# Patient Record
Sex: Female | Born: 1950
Health system: Southern US, Community
[De-identification: ages and names within clinical notes are randomized; demographics above are authoritative.]

## PROBLEM LIST (undated history)

## (undated) DIAGNOSIS — L570 Actinic keratosis: Secondary | ICD-10-CM

## (undated) DIAGNOSIS — M47812 Spondylosis without myelopathy or radiculopathy, cervical region: Secondary | ICD-10-CM

## (undated) DIAGNOSIS — B029 Zoster without complications: Secondary | ICD-10-CM

## (undated) DIAGNOSIS — N8501 Benign endometrial hyperplasia: Secondary | ICD-10-CM

## (undated) DIAGNOSIS — C4362 Malignant melanoma of left upper limb, including shoulder: Secondary | ICD-10-CM

## (undated) DIAGNOSIS — E7800 Pure hypercholesterolemia, unspecified: Secondary | ICD-10-CM

## (undated) DIAGNOSIS — I10 Essential (primary) hypertension: Secondary | ICD-10-CM

## (undated) HISTORY — DX: Zoster without complications: B02.9

## (undated) HISTORY — DX: Malignant melanoma of left upper limb, including shoulder: C43.62

## (undated) HISTORY — DX: Spondylosis without myelopathy or radiculopathy, cervical region: M47.812

## (undated) HISTORY — PX: DILATION AND CURETTAGE OF UTERUS: SHX78

## (undated) HISTORY — DX: Essential (primary) hypertension: I10

## (undated) HISTORY — DX: Actinic keratosis: L57.0

## (undated) HISTORY — DX: Pure hypercholesterolemia, unspecified: E78.00

## (undated) HISTORY — DX: Benign endometrial hyperplasia: N85.01

---

## 1992-04-02 HISTORY — PX: MINOR BREAST BIOPSY: SHX5977

## 1997-12-08 ENCOUNTER — Other Ambulatory Visit: Admission: RE | Admit: 1997-12-08 | Discharge: 1997-12-08 | Payer: Self-pay | Admitting: *Deleted

## 1998-03-16 ENCOUNTER — Other Ambulatory Visit: Admission: RE | Admit: 1998-03-16 | Discharge: 1998-03-16 | Payer: Self-pay | Admitting: *Deleted

## 1998-12-09 ENCOUNTER — Other Ambulatory Visit: Admission: RE | Admit: 1998-12-09 | Discharge: 1998-12-09 | Payer: Self-pay | Admitting: *Deleted

## 1998-12-10 ENCOUNTER — Other Ambulatory Visit: Admission: RE | Admit: 1998-12-10 | Discharge: 1998-12-10 | Payer: Self-pay | Admitting: *Deleted

## 1999-03-03 HISTORY — PX: BREAST SURGERY: SHX581

## 1999-07-20 ENCOUNTER — Ambulatory Visit (HOSPITAL_BASED_OUTPATIENT_CLINIC_OR_DEPARTMENT_OTHER): Admission: RE | Admit: 1999-07-20 | Discharge: 1999-07-20 | Payer: Self-pay | Admitting: Surgery

## 1999-07-20 ENCOUNTER — Encounter (INDEPENDENT_AMBULATORY_CARE_PROVIDER_SITE_OTHER): Payer: Self-pay | Admitting: *Deleted

## 1999-12-13 ENCOUNTER — Other Ambulatory Visit: Admission: RE | Admit: 1999-12-13 | Discharge: 1999-12-13 | Payer: Self-pay | Admitting: *Deleted

## 2000-02-28 ENCOUNTER — Ambulatory Visit (HOSPITAL_COMMUNITY): Admission: RE | Admit: 2000-02-28 | Discharge: 2000-02-28 | Payer: Self-pay | Admitting: Neurology

## 2000-02-28 ENCOUNTER — Encounter: Payer: Self-pay | Admitting: Neurology

## 2000-12-11 ENCOUNTER — Other Ambulatory Visit: Admission: RE | Admit: 2000-12-11 | Discharge: 2000-12-11 | Payer: Self-pay | Admitting: *Deleted

## 2001-02-14 ENCOUNTER — Ambulatory Visit (HOSPITAL_COMMUNITY): Admission: RE | Admit: 2001-02-14 | Discharge: 2001-02-14 | Payer: Self-pay | Admitting: Gastroenterology

## 2002-02-19 ENCOUNTER — Other Ambulatory Visit: Admission: RE | Admit: 2002-02-19 | Discharge: 2002-02-19 | Payer: Self-pay | Admitting: *Deleted

## 2003-03-03 ENCOUNTER — Other Ambulatory Visit: Admission: RE | Admit: 2003-03-03 | Discharge: 2003-03-03 | Payer: Self-pay | Admitting: *Deleted

## 2003-05-05 ENCOUNTER — Ambulatory Visit (HOSPITAL_COMMUNITY): Admission: RE | Admit: 2003-05-05 | Discharge: 2003-05-05 | Payer: Self-pay | Admitting: Family Medicine

## 2004-01-25 ENCOUNTER — Ambulatory Visit (HOSPITAL_COMMUNITY): Admission: RE | Admit: 2004-01-25 | Discharge: 2004-01-25 | Payer: Self-pay | Admitting: Family Medicine

## 2004-03-08 ENCOUNTER — Other Ambulatory Visit: Admission: RE | Admit: 2004-03-08 | Discharge: 2004-03-08 | Payer: Self-pay | Admitting: *Deleted

## 2004-04-24 ENCOUNTER — Ambulatory Visit (HOSPITAL_COMMUNITY): Admission: RE | Admit: 2004-04-24 | Discharge: 2004-04-24 | Payer: Self-pay | Admitting: Obstetrics and Gynecology

## 2004-04-24 ENCOUNTER — Encounter (INDEPENDENT_AMBULATORY_CARE_PROVIDER_SITE_OTHER): Payer: Self-pay | Admitting: Specialist

## 2004-04-24 ENCOUNTER — Ambulatory Visit (HOSPITAL_BASED_OUTPATIENT_CLINIC_OR_DEPARTMENT_OTHER): Admission: RE | Admit: 2004-04-24 | Discharge: 2004-04-24 | Payer: Self-pay | Admitting: Obstetrics and Gynecology

## 2004-04-24 DIAGNOSIS — N8501 Benign endometrial hyperplasia: Secondary | ICD-10-CM

## 2004-04-24 HISTORY — PX: HYSTEROSCOPY WITH RESECTOSCOPE: SHX5395

## 2004-04-24 HISTORY — DX: Benign endometrial hyperplasia: N85.01

## 2005-03-09 ENCOUNTER — Other Ambulatory Visit: Admission: RE | Admit: 2005-03-09 | Discharge: 2005-03-09 | Payer: Self-pay | Admitting: Obstetrics and Gynecology

## 2005-06-14 ENCOUNTER — Ambulatory Visit (HOSPITAL_COMMUNITY): Admission: RE | Admit: 2005-06-14 | Discharge: 2005-06-14 | Payer: Self-pay | Admitting: Family Medicine

## 2006-04-16 ENCOUNTER — Other Ambulatory Visit: Admission: RE | Admit: 2006-04-16 | Discharge: 2006-04-16 | Payer: Self-pay | Admitting: Obstetrics and Gynecology

## 2007-05-05 ENCOUNTER — Other Ambulatory Visit: Admission: RE | Admit: 2007-05-05 | Discharge: 2007-05-05 | Payer: Self-pay | Admitting: Obstetrics and Gynecology

## 2007-09-01 LAB — HM DEXA SCAN: HM Dexa Scan: NORMAL

## 2007-09-25 ENCOUNTER — Ambulatory Visit (HOSPITAL_COMMUNITY): Admission: RE | Admit: 2007-09-25 | Discharge: 2007-09-25 | Payer: Self-pay | Admitting: Family Medicine

## 2007-09-29 ENCOUNTER — Ambulatory Visit (HOSPITAL_COMMUNITY): Admission: RE | Admit: 2007-09-29 | Discharge: 2007-09-29 | Payer: Self-pay | Admitting: Family Medicine

## 2008-05-05 ENCOUNTER — Other Ambulatory Visit: Admission: RE | Admit: 2008-05-05 | Discharge: 2008-05-05 | Payer: Self-pay | Admitting: Obstetrics and Gynecology

## 2008-10-19 ENCOUNTER — Ambulatory Visit (HOSPITAL_COMMUNITY): Admission: RE | Admit: 2008-10-19 | Discharge: 2008-10-19 | Payer: Self-pay | Admitting: Family Medicine

## 2009-05-12 LAB — HM PAP SMEAR: HM Pap smear: NEGATIVE

## 2009-12-02 ENCOUNTER — Ambulatory Visit (HOSPITAL_COMMUNITY): Admission: RE | Admit: 2009-12-02 | Discharge: 2009-12-02 | Payer: Self-pay | Admitting: Family Medicine

## 2010-04-23 ENCOUNTER — Encounter: Payer: Self-pay | Admitting: Family Medicine

## 2010-08-18 NOTE — Procedures (Signed)
[facility]  Patient:    Virginia Brooks, Virginia Brooks Visit Number: [id number] MRN: [id number]          Service Type: END Location: ENDO Attending Physician:  Louie Bun Proc. Date: 02/14/01 Admit Date:  02/14/2001   CC:         Andres Ege, M.D.   Procedure Report  PROCEDURE:  Colonoscopy.  INDICATION FOR PROCEDURE:  Intermittent rectal bleeding in a 60 year old patient with no prior colon screening.  DESCRIPTION OF PROCEDURE:  The patient was placed in the left lateral decubitus position and placed on the pulse monitor with continuous low-flow oxygen delivered by nasal cannula.  She was sedated with 50 mg of IV Demerol and 5 mg of IV Versed.  The Olympus video colonoscope was inserted into the rectum and advanced to the cecum, confirmed by transillumination at McBurneys point and visualization of the ileocecal valve and appendiceal orifice.  The prep was excellent.  The cecum, ascending, transverse, descending, and sigmoid colon all appeared normal with no masses, polyps, diverticula or other mucosal abnormalities.  The rectum likewise appeared normal, and retroflex view of the anus revealed small internal hemorrhoids with no stigma of hemorrhage.  The colonoscope was then withdrawn and the patient returned to the recovery room in stable condition.  She tolerated the procedure well, and there were no immediate complications.  IMPRESSION: 1. Internal hemorrhoids. 2. Otherwise normal colonoscopy.  PLAN:  Flexible sigmoidoscopy and Hemoccults in five years. Attending Physician:  Louie Bun DD:  02/14/01 TD:  02/14/01 Job: [id number]

## 2010-08-18 NOTE — Op Note (Signed)
[place]  Patient:    Virginia Brooks, Virginia Brooks                          MRN: [id number] Proc. Date: 07/20/99 Adm. Date:  [id number] Attending:  Charlton Haws CC:         Jeralyn Ruths, M.D.             Luciana Axe, M.D., [place]                           Operative Report  OFFICE MEDICAL RECORD #:  [id number]  PREOPERATIVE DIAGNOSIS:  Probable intraductal papilloma - left breast.  POSTOPERATIVE DIAGNOSIS:  Probable intraductal papilloma - left breast.  OPERATION:  Excision left breast ductal tissue.  SURGEON:  Currie Paris, M.D.  ASSISTANTLorin Picket Long, PA-student.  ANESTHESIA:  General.  CLINICAL HISTORY:  This patient is a 60 year old, who has presented with some intermittently bloody nipple discharge from a 6 oclock position of the left breast.  Dr. Cheree Ditto showed what appeared to be a filling defect.  DESCRIPTION OF PROCEDURE:  Patient brought to the operating room.  After satisfactory general anesthesia had been obtained, the breast was prepped and draped.  With gentle manipulation, I got a small amount of rusty colored fluid rom 6 oclock duct and was able to cannulate it with a small tear duct probe.  With he tear duct probe in place, I made an incision near the areolar margin inferiorly and lifted a skin flap back towards the ductal area.  I was able then to identify dilated milk duct and dissected around this with a hemostat.  The probe was removed and the duct divided off of the undersurface of the nipple.  I was able to identify its approximate tract with the tear duct probe and then using cutting current of the cautery, took an area of tissue about 1.5 cm in diameter around the duct going about 3-4 cm deep.  This was sent for pathology.  The area was checked for hemostasis, when everything was dry, was closed with some 3-0 Vicryl.  I did free up the nipple a little bit from underneath and initially put a  purse-string in o try to evert the nipple since the patient had been concerned that her nipple had been everted all of her life, but on doing that, the nipple looked a little bit  ischemic, so I took that suture out and the nipple then looked perfectly viable and appeared to stay somewhat everted, but I elected not to put any further sutures in because I was concerned about the blood supply being impaired if I did so. Since everything was dry, I then closed the dermis with 4-0 Monocryl subcuticular, plus Steri-Strips.  The patient tolerated the procedure well.  There were no operative complications.  All counts were correct. DD:  07/20/99 TD:  07/20/99 Job: [id number]

## 2010-08-18 NOTE — Op Note (Signed)
NAME:  Virginia Brooks, Virginia Brooks                 ACCOUNT NO.:  [id number]   MEDICAL RECORD NO.:  [id number]          PATIENT TYPE:  AMB   LOCATION:  [facility]                         FACILITY:  [facility]   PHYSICIAN:  Cynthia P. Romine, M.D.DATE OF BIRTH:  Dec 01, 1950   DATE OF PROCEDURE:  04/24/2004  DATE OF DISCHARGE:                                 OPERATIVE REPORT   PREOPERATIVE DIAGNOSIS:  Postmenopausal bleeding.   POSTOPERATIVE DIAGNOSES:  1.  Postmenopausal bleeding.  2.  Endometrial polyps, pathology pending.   PROCEDURES:  1.  Hysteroscopic resection of endometrial polyps.  2.  D&C.   SURGEON:  Cynthia P. Romine, M.D.   ANESTHESIA:  General by LMA.   ESTIMATED BLOOD LOSS:  30 mL.   COMPLICATIONS:  None.   DESCRIPTION OF PROCEDURE:  The patient was taken to the operating room and  after the induction of adequate general anesthesia, was placed in the dorsal  lithotomy position and prepped and draped in the usual fashion.  The bladder  was drained with a red rubber catheter.  The cervix was grasped at the  anterior lip with a single-tooth tenaculum and 10 mL of 1% Xylocaine was  injected into the cervical stroma.  Attempt was made to sound the uterus,  however, the os was too small to allow passage of the sound.  Therefore, the  very smallest Pratt dilators were used starting with a #7 and this was  passed easily through the os and sequential dilating allowed the cervix to  be dilated up to a #23 which should be large enough for the diagnostic  hysteroscope.  The uterus was then sounded to 8 cm.  The diagnostic  hysteroscope was introduced.  There were noted to be two polyps in the  corpus, one emanating from the right mid section and one from the left.  Photographic documentation was taken.  The diagnostic scope was removed.  The cervix was dilated to #31 Shawnie Pons.  The operative scope was introduced.  Sorbitol was used as a distention medium.  The pressure on the Sorbitol pump  was set at  70 mmHg.  A single loop was used to remove the two endometrial  polyps.  The cavity then appeared clean.  The scope was withdrawn.  Curettage was carried out and endometrial curettings were sent to pathology  as well.  Instruments were removed from the vagina and the procedure was  terminated.  The patient tolerated it well and went in satisfactory  condition to post anesthesia recovery.  Sorbitol deficit was 85 mL.                                               ______________________________  Edwena Felty. Romine, M.D.   CPR/MEDQ  D:  04/24/2004  T:  04/24/2004  Job:  [id number]

## 2010-09-11 LAB — HM COLONOSCOPY: HM Colonoscopy: NORMAL

## 2010-12-22 ENCOUNTER — Other Ambulatory Visit (HOSPITAL_COMMUNITY): Payer: Self-pay | Admitting: Family Medicine

## 2010-12-22 ENCOUNTER — Ambulatory Visit (HOSPITAL_COMMUNITY)
Admission: RE | Admit: 2010-12-22 | Discharge: 2010-12-22 | Disposition: A | Discharge status: Home / Self Care | Payer: Federal, State, Local not specified - PPO | Source: Ambulatory Visit | Attending: Family Medicine | Admitting: Family Medicine

## 2010-12-22 DIAGNOSIS — Z0000 Encounter for general adult medical examination without abnormal findings: Secondary | ICD-10-CM

## 2010-12-22 DIAGNOSIS — E785 Hyperlipidemia, unspecified: Secondary | ICD-10-CM

## 2010-12-22 DIAGNOSIS — Z01419 Encounter for gynecological examination (general) (routine) without abnormal findings: Secondary | ICD-10-CM

## 2012-05-20 ENCOUNTER — Encounter: Payer: Self-pay | Admitting: Obstetrics and Gynecology

## 2012-06-25 ENCOUNTER — Encounter: Payer: Self-pay | Admitting: Obstetrics and Gynecology

## 2012-06-25 ENCOUNTER — Ambulatory Visit (INDEPENDENT_AMBULATORY_CARE_PROVIDER_SITE_OTHER): Payer: Federal, State, Local not specified - PPO | Admitting: Obstetrics and Gynecology

## 2012-06-25 VITALS — BP 152/78 | Ht 63.0 in | Wt 131.0 lb

## 2012-06-25 DIAGNOSIS — Z01419 Encounter for gynecological examination (general) (routine) without abnormal findings: Secondary | ICD-10-CM

## 2012-06-25 MED ORDER — ESTROGENS CONJUGATED 0.3 MG PO TABS: 0.3000 mg | ORAL_TABLET | Freq: Every day | ORAL | Status: DC

## 2012-06-25 MED ORDER — MEDROXYPROGESTERONE ACETATE 2.5 MG PO TABS: 2.5000 mg | ORAL_TABLET | Freq: Every day | ORAL | Status: DC

## 2012-06-25 NOTE — Progress Notes (Signed)
62 y.o. WidowedCaucasian female   G2P2 here for annual exam.  Good year.  No new medical history.  On HRT, feels good, wants to continue.  Originally went on for hot flashes, and has reduced dose since originally rx'd.  Instructed pt she may stop at any time if she desires. No vaginal bleeding.   Patient's last menstrual period was 04/02/2004.          Sexually active: no  The current method of family planning is none.    Exercising: cardio, walking,light wts  [place] Last mammogram:  06/11/2012 normal Last pap smear:05/12/2009 neg History of abnormal pap: 51yrs ago Smoking:no Alcohol:no Last colonoscopy:09/11/2010 normal repeat in 22yrs Last Bone Density:  09/01/2007 normal  Insurance doesn't pay for it and so she doesn't want to have it. Last tetanus shot:01/02/2006 Last cholesterol check: 12/26/2011 slightly elevated  Hgb:      pcp           Urine: pcp    Health Maintenance  Topic Date Due  . Influenza Vaccine  [birth year]01/1948  . Tetanus/tdap  09/04/1967  . Zostavax  09/03/2008  . Pap Smear  05/13/2012  . Mammogram  05/21/2013  . Colonoscopy  07/25/2015    Family History  Problem Relation Age of Onset  . Colon cancer Other   . COPD Mother   . Angina Mother   . Stroke Father   . Heart attack Father   . Colon cancer Sister   . Cancer Brother   . Diabetes Brother     prostate ca, past h/o bladder ca    There is no problem list on file for this patient.   Past Medical History  Diagnosis Date  . Hypercholesterolemia   . Spondylosis of cervical joint   . Endometrial hyperplasia without atypia, complex 04/24/2004    on hysteroscopic resection of polyps    Past Surgical History  Procedure Laterality Date  . Hysteroscopy with resectoscope  04/24/2004  . Minor breast biopsy Right 1994  . Breast surgery  Dec 2000    excision papilloma    Allergies: Review of patient's allergies indicates no known allergies.  Current Outpatient Prescriptions  Medication Sig  Dispense Refill  . aspirin 81 MG tablet Take 81 mg by mouth daily.      . Calcium-Vitamin D (CALTRATE 600 PLUS-VIT D PO) Take 1 tablet by mouth daily.      . cholecalciferol (VITAMIN D) 1000 UNITS tablet Take 1,000 Units by mouth daily.      Marland Kitchen estrogens, conjugated, (PREMARIN) 0.3 MG tablet Take 0.3 mg by mouth daily. Take daily for 21 days then do not take for 7 days.      . fish oil-omega-3 fatty acids 1000 MG capsule Take 1 g by mouth daily.      . medroxyPROGESTERone (PROVERA) 2.5 MG tablet Take 2.5 mg by mouth daily.      . MULTIPLE VITAMINS PO Take 1 tablet by mouth daily.       No current facility-administered medications for this visit.    ROS: Pertinent items are noted in HPI.  Exam:    BP 152/78  Ht 5\' 3"  (1.6 m)  Wt 131 lb (59.421 kg)  BMI 23.21 kg/m2  LMP 04/02/2004   Wt Readings from Last 3 Encounters:  06/25/12 131 lb (59.421 kg)     Ht Readings from Last 3 Encounters:  06/25/12 5\' 3"  (1.6 m)    General appearance: alert, cooperative and appears  stated age Head: Normocephalic, without obvious abnormality, atraumatic Neck: no adenopathy, supple, symmetrical, trachea midline and thyroid not enlarged, symmetric, no tenderness/mass/nodules Lungs: clear to auscultation bilaterally Breasts: Inspection negative, No nipple retraction or dimpling, No nipple discharge or bleeding, No axillary or supraclavicular adenopathy, Normal to palpation without dominant masses Heart: regular rate and rhythm Abdomen: soft, non-tender; bowel sounds normal; no masses,  no organomegaly Extremities: extremities normal, atraumatic, no cyanosis or edema Skin: Skin color, texture, turgor normal. No rashes or lesions Lymph nodes: Cervical, supraclavicular, and axillary nodes normal. No abnormal inguinal nodes palpated Neurologic: Grossly normal   Pelvic: External genitalia:  no lesions              Urethra:  normal appearing urethra with no masses, tenderness or lesions               Bartholins and Skenes: normal                 Vagina: normal appearing vagina with normal color and discharge, no lesions              Cervix: normal appearance              Pap taken: yes        Bimanual Exam:  Uterus:  uterus is normal size, shape, consistency and nontender                                      Adnexa: normal adnexa in size, nontender and no masses                                      Rectovaginal: Confirms                                      Anus:  normal sphincter tone, no lesions  A: nl menopausal exam, on HRT  P: mammogram counseled on breast self exam, mammography screening, adequate intake of calcium and vitamin D, diet and exercise return annually or prn     An After Visit Summary was printed and given to the patient.

## 2012-06-25 NOTE — Patient Instructions (Signed)

## 2012-06-26 LAB — IPS PAP TEST WITH REFLEX TO HPV

## 2012-06-27 ENCOUNTER — Other Ambulatory Visit: Payer: Self-pay | Admitting: *Deleted

## 2012-06-27 MED ORDER — ESTROGENS CONJUGATED 0.3 MG PO TABS: 0.3000 mg | ORAL_TABLET | Freq: Every day | ORAL | Status: DC

## 2013-07-01 ENCOUNTER — Encounter: Payer: Self-pay | Admitting: Obstetrics and Gynecology

## 2013-07-01 ENCOUNTER — Ambulatory Visit (INDEPENDENT_AMBULATORY_CARE_PROVIDER_SITE_OTHER): Payer: Federal, State, Local not specified - PPO | Admitting: Obstetrics and Gynecology

## 2013-07-01 VITALS — BP 118/70 | HR 66 | Ht 63.25 in | Wt 127.0 lb

## 2013-07-01 DIAGNOSIS — Z0000 Encounter for general adult medical examination without abnormal findings: Secondary | ICD-10-CM

## 2013-07-01 DIAGNOSIS — Z01419 Encounter for gynecological examination (general) (routine) without abnormal findings: Secondary | ICD-10-CM

## 2013-07-01 MED ORDER — ESTROGENS CONJUGATED 0.3 MG PO TABS: 0.3000 mg | ORAL_TABLET | Freq: Every day | ORAL | Status: DC

## 2013-07-01 MED ORDER — MEDROXYPROGESTERONE ACETATE 2.5 MG PO TABS: 2.5000 mg | ORAL_TABLET | Freq: Every day | ORAL | Status: DC

## 2013-07-01 NOTE — Progress Notes (Signed)
Patient ID: Virginia Brooks, female   DOB: 1950-11-03, 63 y.o.   MRN: [id number] GYNECOLOGY VISIT  PCP:  Sharilyn Sites, MD  Referring provider:   HPI: 63 y.o.   Widowed  Caucasian  female   G33P2 with Patient's last menstrual period was 04/02/2004.   here for  AEX.  Patient is on Premarin 0.3 and Provera 2.5 mg daily.  Taking for vasomotor symptoms.  No spotting or bleeding.  Wants to continue with HRT.   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 Traveling also to [place].   Hgb:   14.5 with PCP 12/2012 Urine:  Neg  GYNECOLOGIC HISTORY: Patient's last menstrual period was 04/02/2004. Sexually active:  no Partner preference: female Contraception: postmenopausal   Menopausal hormone therapy: Premarin and Provera DES exposure:  no  Blood transfusions: no   Sexually transmitted diseases:  no  GYN procedures and prior surgeries: Hysteroscopic resection of polyps 2006 - history of complex hyperplasia without atypia, Bilateral breast biopsies--benign.  Last mammogram: 06-15-13 RCV:ELFYB                Last pap and high risk HPV testing:  06/25/12 wnl:no HR HPV testing done.   History of abnormal pap smear:  no   OB History   Grav Para Term Preterm Abortions TAB SAB Ect Mult Living   2 2               LIFESTYLE: Exercise:  Treadmill, sit-ups and stretching/bending             Tobacco:   no Alcohol:     no Drug use:  no  OTHER HEALTH MAINTENANCE: Tetanus/TDap:  12/2005 Gardisil:              n/a Influenza:           12/2012 Zostavax:           Completed with PCP  Bone density:     2009 wnl Colonoscopy:     09/2010 normal with [facility].  Next colonoscopy due 6/ 2017 because of family history of colon cancer.  Cholesterol check:  wnl with PCP  Family History  Problem Relation Age of Onset  . Colon cancer Other   . COPD Mother   . Angina Mother   . Stroke Father   . Heart attack Father   . Colon cancer Sister   . Cancer Brother   . Diabetes Brother     prostate ca, past h/o  bladder ca    There are no active problems to display for this patient.  Past Medical History  Diagnosis Date  . Hypercholesterolemia   . Spondylosis of cervical joint   . Endometrial hyperplasia without atypia, complex 04/24/2004    on hysteroscopic resection of polyps    Past Surgical History  Procedure Laterality Date  . Hysteroscopy with resectoscope  04/24/2004  . Minor breast biopsy Right 1994  . Breast surgery  Dec 2000    excision papilloma    ALLERGIES: Review of patient's allergies indicates no known allergies.  Current Outpatient Prescriptions  Medication Sig Dispense Refill  . aspirin 81 MG tablet Take 81 mg by mouth daily.      . Calcium-Vitamin D (CALTRATE 600 PLUS-VIT D PO) Take 1 tablet by mouth daily.      . cholecalciferol (VITAMIN D) 1000 UNITS tablet Take 1,000 Units by mouth daily.      Marland Kitchen estrogens, conjugated, (PREMARIN) 0.3 MG tablet Take 1 tablet (0.3 mg total) by  mouth daily.  90 tablet  3  . fish oil-omega-3 fatty acids 1000 MG capsule Take 1 g by mouth daily.      . medroxyPROGESTERone (PROVERA) 2.5 MG tablet Take 1 tablet (2.5 mg total) by mouth daily.  90 tablet  3  . MULTIPLE VITAMINS PO Take 1 tablet by mouth daily.       No current facility-administered medications for this visit.     ROS:  Pertinent items are noted in HPI.  SOCIAL HISTORY:  Widow.   Husband died of esophageal cancer.  4 grandchildren.  Helps to take care of them.   PHYSICAL EXAMINATION:    BP 118/70  Pulse 66  Ht 5' 3.25" (1.607 m)  Wt 127 lb (57.607 kg)  BMI 22.31 kg/m2  LMP 04/02/2004   Wt Readings from Last 3 Encounters:  07/01/13 127 lb (57.607 kg)  06/25/12 131 lb (59.421 kg)     Ht Readings from Last 3 Encounters:  07/01/13 5' 3.25" (1.607 m)  06/25/12 5\' 3"  (1.6 m)    General appearance: alert, cooperative and appears stated age Head: Normocephalic, without obvious abnormality, atraumatic Neck: no adenopathy, supple, symmetrical, trachea midline and  thyroid not enlarged, symmetric, no tenderness/mass/nodules Lungs: clear to auscultation bilaterally Breasts: Inspection negative, No nipple retraction or dimpling, No nipple discharge or bleeding, No axillary or supraclavicular adenopathy, Normal to palpation without dominant masses Heart: regular rate and rhythm Abdomen: soft, non-tender; no masses,  no organomegaly Extremities: extremities normal, atraumatic, no cyanosis or edema Skin: Skin color, texture, turgor normal. No rashes or lesions Lymph nodes: Cervical, supraclavicular, and axillary nodes normal. No abnormal inguinal nodes palpated Neurologic: Grossly normal  Pelvic: External genitalia:  no lesions              Urethra:  normal appearing urethra with no masses, tenderness or lesions              Bartholins and Skenes: normal                 Vagina: normal appearing vagina with normal color and discharge, no lesions              Cervix: normal appearance              Pap and high risk HPV testing done: no.            Bimanual Exam:  Uterus:  uterus is normal size, shape, consistency and nontender                                      Adnexa: normal adnexa in size, nontender and no masses                                      Rectovaginal: Confirms                                      Anus:  normal sphincter tone, no lesions  ASSESSMENT  Normal gynecologic exam. HRT patient.   PLAN  Mammogram recommended yearly.  Pap smear and high risk HPV testing Counseled on self breast exam, Calcium and vitamin D intake, exercise. Will continue with Premarin 0.3 mg daily and Provera 2.5 mg daily #90 RF 3.  I discussed risks and benefits.  Risks include breast cancer, DVT, PE, MI, and stroke.  Return annually or prn   An After Visit Summary was printed and given to the patient.

## 2013-07-01 NOTE — Patient Instructions (Signed)

## 2013-07-08 ENCOUNTER — Ambulatory Visit: Payer: Federal, State, Local not specified - PPO | Admitting: Obstetrics and Gynecology

## 2013-11-30 ENCOUNTER — Telehealth: Payer: Self-pay

## 2013-11-30 NOTE — Telephone Encounter (Signed)
Please schedule an appointment for the patient to discuss in the office.  Thanks.

## 2013-11-30 NOTE — Telephone Encounter (Signed)
Received mailed letter from patient regarding medication management. Patient states that her family doctor Dr.Golding has advised patient to come off of her Premarin and medroxyprogesterone. Patient was placed on these ten years ago after a D & C. Patient would like to know your recommendations on if she should come off of these at this time. Letter to your desk for review.

## 2013-12-01 NOTE — Telephone Encounter (Signed)
Spoke with patient. Advised of message as seen below from [place]. Patient is agreeable but is in the car and will have to call back to schedule appointment.

## 2013-12-01 NOTE — Telephone Encounter (Signed)
Spoke with patient. Appointment scheduled for tomorrow at 2:45pm with Dr.Silva. Patient agreeable to date and time.  Routing to provider for final review. Patient agreeable to disposition. Will close encounter

## 2013-12-02 ENCOUNTER — Ambulatory Visit (INDEPENDENT_AMBULATORY_CARE_PROVIDER_SITE_OTHER): Payer: Medicare Other | Admitting: Obstetrics and Gynecology

## 2013-12-02 ENCOUNTER — Encounter: Payer: Self-pay | Admitting: Obstetrics and Gynecology

## 2013-12-02 VITALS — BP 128/70 | HR 70 | Ht 63.25 in | Wt 132.2 lb

## 2013-12-02 DIAGNOSIS — Z780 Asymptomatic menopausal state: Secondary | ICD-10-CM

## 2013-12-02 DIAGNOSIS — N951 Menopausal and female climacteric states: Secondary | ICD-10-CM | POA: Diagnosis not present

## 2013-12-02 NOTE — Patient Instructions (Signed)
Menopause and Herbal Products Menopause is the normal time of life when menstrual periods stop completely. Menopause is complete when you have missed 12 consecutive menstrual periods. It usually occurs between the ages of 48 to 55, with an average age of 51. Very rarely does a woman develop menopause before 65 years old. At menopause, your ovaries stop producing the female hormones, estrogen and progesterone. This can cause undesirable symptoms and also affect your health. Sometimes the symptoms can occur 4 to 5 years before the menopause begins. There is no relationship between menopause and:  Oral contraceptives.  Number of children you had.  Race.  The age your menstrual periods started (menarche). Heavy smokers and very thin women may develop menopause earlier in life. Estrogen and progesterone hormone treatment is the usual method of treating menopausal symptoms. However, there are women who should not take hormone treatment. This is true of:   Women that have breast or uterine cancer.  Women who prefer not to take hormones because of certain side effects (abnormal uterine bleeding).  Women who are afraid that hormones may cause breast cancer.  Women who have a history of liver disease, heart disease, stroke, or blood clots. For these women, there are other medications that may help treat their menopausal symptoms. These medications are found in plants and botanical products. They can be found in the form of herbs, teas, oils, tinctures, and pills.  CAUSES:  The ovaries stop producing the female hormones estrogen and progesterone.  Other causes include:  Surgery to remove both ovaries.  The ovaries stop functioning for no know reason.  Tumors of the pituitary gland in the brain.  Medical disease that affects the ovaries and hormone production.  Radiation treatment to the abdomen or pelvis.  Chemotherapy that affects the ovaries. PHYTOESTROGENS: Phytoestrogens occur  naturally in plants and plant products. They act like estrogen in the body. Herbal medications are made from these plants and botanical steroids. There are 3 types of phytoestrogens:  Isoflavones (genistein and daidzein) are found in soy, garbanzo beans, miso and tofu foods.  Ligins are found in the shell of seeds. They are used to make oils like flaxseed oil. The bacteria in your intestine act on these foods to produce the estrogen-like hormones.  Coumestans are estrogen-like. Some of the foods they are found in include sunflower seeds and bean sprouts. CONDITIONS AND THEIR POSSIBLE HERBAL TREATMENT:  Hot flashes and night sweats.  Soy, black cohosh and evening primrose.  Irritability, insomnia, depression and memory problems.  Chasteberry, ginseng, and soy.  St. John's wort may be helpful for depression. However, there is a concern of it causing cataracts of the eye and may have bad effects on other medications. St. John's wort should not be taken for long time and without your caregiver's advice.  Loss of libido and vaginal and skin dryness.  Wild yam and soy.  Prevention of coronary heart disease and osteoporosis.  Soy and Isoflavones. Several studies have shown that some women benefit from herbal medications, but most of the studies have not consistently shown that these supplements are much better than placebo. Other forms of treatment to help women with menopausal symptoms include a balanced diet, rest, exercise, vitamin and calcium (with vitamin D) supplements, acupuncture, and group therapy when necessary. THOSE WHO SHOULD NOT TAKE HERBAL MEDICATIONS INCLUDE:  Women who are planning on getting pregnant unless told by your caregiver.  Women who are breastfeeding unless told by your caregiver.  Women who are taking other   prescription medications unless told by your caregiver.  Infants, children, and elderly women unless told by your caregiver. Different herbal medications  have different and unmeasured amounts of the herbal ingredients. There are no regulations, quality control, and standardization of the ingredients in herbal medications. Therefore, the amount of the ingredient in the medication may vary from one herb, pill, tea, oil or tincture to another. Many herbal medications can cause serious problems and can even have poisonous effects if taken too much or too long. If problems develop, the medication should be stopped and recorded by your caregiver. HOME CARE INSTRUCTIONS  Do not take or give children herbal medications without your caregiver's advice.  Let your caregiver know all the medications you are taking. This includes prescription, over-the-counter, eye drops, and creams.  Do not take herbal medications longer or more than recommended.  Tell your caregiver about any side effects from the medication. SEEK MEDICAL CARE IF:  You develop a fever of 102 F (38.9 C), or as directed by your caregiver.  You feel sick to your stomach (nauseous), vomit, or have diarrhea.  You develop a rash.  You develop abdominal pain.  You develop severe headaches.  You start to have vision problems.  You feel dizzy or faint.  You start to feel numbness in any part of your body.  You start shaking (have convulsions). Document Released: 09/05/2007 Document Revised: 03/05/2012 Document Reviewed: 04/04/2010 Exit[employer] Patient Information 2015 [employer]. This information is not intended to replace advice given to you by your health care provider. Make sure you discuss any questions you have with your health care provider.  

## 2013-12-02 NOTE — Progress Notes (Signed)
Patient ID: Virginia Brooks, female   DOB: 03-Dec-1950, 63 y.o.   MRN: [id number] GYNECOLOGY  VISIT   PCP - Dr. Hilma Favors   HPI: 63 y.o.   Widowed  Caucasian  female   G44P2 with Patient's last menstrual period was 04/02/2004.   here for   Consultation regarding HRT. Has been on HRT for 20 years.   Patient had early menopause.  Has been on this regimen for about 10 years since her dilation and curettage for complex endometrial hyperplasia.  No bleeding since dilation and curettage.  Occasionally feels warm but does not feel she has hot flashes.  Sleeps well other than up 1 -2 times to void.   Father had heart attack and stroke.  Mother had angina.  Mother died in as a pedestrian vs. MVA accident.   Patient wakes up at night 2 - 3 times a year with pain in chest and this lasts a few minutes.  Takes an aspirin.  Had a stress test and EKG which were normal.  Had testing at [facility]. 2005 or 2006.   Does vigorous exercise several days a week.   Is going to see her PCP in about one week for routine visit.   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   GYNECOLOGIC HISTORY: Patient's last menstrual period was 04/02/2004. Contraception:  Postmenopausal. Menopausal hormone therapy: Premarin 0.3mg , Provera 2.5mg         OB History   Grav Para Term Preterm Abortions TAB SAB Ect Mult Living   2 2                 There are no active problems to display for this patient.   Past Medical History  Diagnosis Date  . Hypercholesterolemia   . Spondylosis of cervical joint   . Endometrial hyperplasia without atypia, complex 04/24/2004    on hysteroscopic resection of polyps    Past Surgical History  Procedure Laterality Date  . Hysteroscopy with resectoscope  04/24/2004  . Minor breast biopsy Right 1994  . Breast surgery  Dec 2000    excision papilloma    Current Outpatient Prescriptions  Medication Sig Dispense Refill  . aspirin 81 MG tablet Take 81 mg by mouth  daily.      . Calcium-Vitamin D (CALTRATE 600 PLUS-VIT D PO) Take 1 tablet by mouth daily.      . cholecalciferol (VITAMIN D) 1000 UNITS tablet Take 1,000 Units by mouth daily.      Marland Kitchen estrogens, conjugated, (PREMARIN) 0.3 MG tablet Take 1 tablet (0.3 mg total) by mouth daily.  90 tablet  3  . fish oil-omega-3 fatty acids 1000 MG capsule Take 1 g by mouth daily.      . medroxyPROGESTERone (PROVERA) 2.5 MG tablet Take 1 tablet (2.5 mg total) by mouth daily.  90 tablet  3  . MULTIPLE VITAMINS PO Take 1 tablet by mouth daily.       No current facility-administered medications for this visit.     ALLERGIES: Review of patient's allergies indicates no known allergies.  Family History  Problem Relation Age of Onset  . Colon cancer Other   . COPD Mother   . Angina Mother   . Stroke Father   . Heart attack Father   . Colon cancer Sister   . Cancer Brother   . Diabetes Brother     prostate ca, past h/o bladder ca    History   Social History  . Marital Status: Widowed  Spouse Name: N/A    Number of Children: N/A  . Years of Education: N/A   Occupational History  . Not on file.   Social History Main Topics  . Smoking status: Never Smoker   . Smokeless tobacco: Not on file  . Alcohol Use: No  . Drug Use: No  . Sexual Activity: No   Other Topics Concern  . Not on file   Social History Narrative  . No narrative on file    ROS:  Pertinent items are noted in HPI.  PHYSICAL EXAMINATION:    BP 128/70  Pulse 70  Ht 5' 3.25" (1.607 m)  Wt 132 lb 3.2 oz (59.966 kg)  BMI 23.22 kg/m2  LMP 04/02/2004     General appearance: alert, cooperative and appears stated age  ASSESSMENT  Menopausal HRT.  Remote history of complex endometrial hyperplasia. Family history of cardiovascular disease in father.  Nonspecific chest pain.  Negative prior evaluation.   PLAN  Counseled extensively on risks of benefits of HRT. Women's Health Initiative reviewed.  See labs:  No. Patient  chooses to stop HRT. I recommend evaluation with her PCP regarding chest pain.  Return for annual GYN exam and prn.    An After Visit Summary was printed and given to the patient.  __30____ minutes face to face time of which over 50% was spent in counseling.

## 2013-12-11 ENCOUNTER — Other Ambulatory Visit (HOSPITAL_COMMUNITY): Payer: Self-pay | Admitting: Family Medicine

## 2013-12-11 DIAGNOSIS — Z23 Encounter for immunization: Secondary | ICD-10-CM | POA: Diagnosis not present

## 2013-12-11 DIAGNOSIS — Z0000 Encounter for general adult medical examination without abnormal findings: Secondary | ICD-10-CM | POA: Diagnosis not present

## 2013-12-11 DIAGNOSIS — IMO0002 Reserved for concepts with insufficient information to code with codable children: Secondary | ICD-10-CM | POA: Diagnosis not present

## 2013-12-11 DIAGNOSIS — Z01419 Encounter for gynecological examination (general) (routine) without abnormal findings: Secondary | ICD-10-CM | POA: Diagnosis not present

## 2013-12-11 DIAGNOSIS — E785 Hyperlipidemia, unspecified: Secondary | ICD-10-CM | POA: Diagnosis not present

## 2013-12-11 DIAGNOSIS — M810 Age-related osteoporosis without current pathological fracture: Secondary | ICD-10-CM

## 2013-12-15 ENCOUNTER — Other Ambulatory Visit (HOSPITAL_COMMUNITY): Payer: Federal, State, Local not specified - PPO

## 2013-12-18 ENCOUNTER — Ambulatory Visit (HOSPITAL_COMMUNITY)
Admission: RE | Admit: 2013-12-18 | Discharge: 2013-12-18 | Disposition: A | Discharge status: Home / Self Care | Payer: Medicare Other | Source: Ambulatory Visit | Attending: Family Medicine | Admitting: Family Medicine

## 2013-12-18 DIAGNOSIS — M810 Age-related osteoporosis without current pathological fracture: Secondary | ICD-10-CM | POA: Insufficient documentation

## 2013-12-18 DIAGNOSIS — Z23 Encounter for immunization: Secondary | ICD-10-CM | POA: Diagnosis not present

## 2013-12-18 DIAGNOSIS — Z13820 Encounter for screening for osteoporosis: Secondary | ICD-10-CM | POA: Diagnosis not present

## 2013-12-18 DIAGNOSIS — Z780 Asymptomatic menopausal state: Secondary | ICD-10-CM | POA: Diagnosis not present

## 2014-02-01 ENCOUNTER — Encounter: Payer: Self-pay | Admitting: Obstetrics and Gynecology

## 2014-06-14 DIAGNOSIS — Z803 Family history of malignant neoplasm of breast: Secondary | ICD-10-CM | POA: Diagnosis not present

## 2014-06-14 DIAGNOSIS — Z1231 Encounter for screening mammogram for malignant neoplasm of breast: Secondary | ICD-10-CM | POA: Diagnosis not present

## 2014-07-05 ENCOUNTER — Ambulatory Visit (INDEPENDENT_AMBULATORY_CARE_PROVIDER_SITE_OTHER): Payer: Medicare Other | Admitting: Obstetrics and Gynecology

## 2014-07-05 ENCOUNTER — Encounter: Payer: Self-pay | Admitting: Obstetrics and Gynecology

## 2014-07-05 VITALS — BP 120/74 | HR 70 | Resp 20 | Ht 63.5 in | Wt 135.6 lb

## 2014-07-05 DIAGNOSIS — Z01419 Encounter for gynecological examination (general) (routine) without abnormal findings: Secondary | ICD-10-CM

## 2014-07-05 DIAGNOSIS — Z124 Encounter for screening for malignant neoplasm of cervix: Secondary | ICD-10-CM

## 2014-07-05 NOTE — Patient Instructions (Signed)
EXERCISE AND DIET:  We recommended that you start or continue a regular exercise program for good health. Regular exercise means any activity that makes your heart beat faster and makes you sweat.  We recommend exercising at least 30 minutes per day at least 3 days a week, preferably 4 or 5.  We also recommend a diet low in fat and sugar.  Inactivity, poor dietary choices and obesity can cause diabetes, heart attack, stroke, and kidney damage, among others.    ALCOHOL AND SMOKING:  Women should limit their alcohol intake to no more than 7 drinks/beers/glasses of wine (combined, not each!) per week. Moderation of alcohol intake to this level decreases your risk of breast cancer and liver damage. And of course, no recreational drugs are part of a healthy lifestyle.  And absolutely no smoking or even second hand smoke. Most people know smoking can cause heart and lung diseases, but did you know it also contributes to weakening of your bones? Aging of your skin?  Yellowing of your teeth and nails?  CALCIUM AND VITAMIN D:  Adequate intake of calcium and Vitamin D are recommended.  The recommendations for exact amounts of these supplements seem to change often, but generally speaking 600 mg of calcium (either carbonate or citrate) and 800 units of Vitamin D per day seems prudent. Certain women may benefit from higher intake of Vitamin D.  If you are among these women, your doctor will have told you during your visit.    PAP SMEARS:  Pap smears, to check for cervical cancer or precancers,  have traditionally been done yearly, although recent scientific advances have shown that most women can have pap smears less often.  However, every woman still should have a physical exam from her gynecologist every year. It will include a breast check, inspection of the vulva and vagina to check for abnormal growths or skin changes, a visual exam of the cervix, and then an exam to evaluate the size and shape of the uterus and  ovaries.  And after 66 years of age, a rectal exam is indicated to check for rectal cancers. We will also provide age appropriate advice regarding health maintenance, like when you should have certain vaccines, screening for sexually transmitted diseases, bone density testing, colonoscopy, mammograms, etc.   MAMMOGRAMS:  All women over 40 years old should have a yearly mammogram. Many facilities now offer a "3D" mammogram, which may cost around $50 extra out of pocket. If possible,  we recommend you accept the option to have the 3D mammogram performed.  It both reduces the number of women who will be called back for extra views which then turn out to be normal, and it is better than the routine mammogram at detecting truly abnormal areas.    COLONOSCOPY:  Colonoscopy to screen for colon cancer is recommended for all women at age 50.  We know, you hate the idea of the prep.  We agree, BUT, having colon cancer and not knowing it is worse!!  Colon cancer so often starts as a polyp that can be seen and removed at colonscopy, which can quite literally save your life!  And if your first colonoscopy is normal and you have no family history of colon cancer, most women don't have to have it again for 10 years.  Once every ten years, you can do something that may end up saving your life, right?  We will be happy to help you get it scheduled when you are ready.    Be sure to check your insurance coverage so you understand how much it will cost.  It may be covered as a preventative service at no cost, but you should check your particular policy.     Menopause and Herbal Products Menopause is the normal time of life when menstrual periods stop completely. Menopause is complete when you have missed 12 consecutive menstrual periods. It usually occurs between the ages of 33 to 51, with an average age of 9. Very rarely does a woman develop menopause before 66 years old. At menopause, your ovaries stop producing the female  hormones, estrogen and progesterone. This can cause undesirable symptoms and also affect your health. Sometimes the symptoms can occur 4 to 5 years before the menopause begins. There is no relationship between menopause and:  Oral contraceptives.  Number of children you had.  Race.  The age your menstrual periods started (menarche). Heavy smokers and very thin women may develop menopause earlier in life. Estrogen and progesterone hormone treatment is the usual method of treating menopausal symptoms. However, there are women who should not take hormone treatment. This is true of:   Women that have breast or uterine cancer.  Women who prefer not to take hormones because of certain side effects (abnormal uterine bleeding).  Women who are afraid that hormones may cause breast cancer.  Women who have a history of liver disease, heart disease, stroke, or blood clots. For these women, there are other medications that may help treat their menopausal symptoms. These medications are found in plants and botanical products. They can be found in the form of herbs, teas, oils, tinctures, and pills.  CAUSES:  The ovaries stop producing the female hormones estrogen and progesterone.  Other causes include:  Surgery to remove both ovaries.  The ovaries stop functioning for no know reason.  Tumors of the pituitary gland in the brain.  Medical disease that affects the ovaries and hormone production.  Radiation treatment to the abdomen or pelvis.  Chemotherapy that affects the ovaries. PHYTOESTROGENS: Phytoestrogens occur naturally in plants and plant products. They act like estrogen in the body. Herbal medications are made from these plants and botanical steroids. There are 3 types of phytoestrogens:  Isoflavones (genistein and daidzein) are found in soy, garbanzo beans, miso and tofu foods.  Ligins are found in the shell of seeds. They are used to make oils like flaxseed oil. The bacteria in  your intestine act on these foods to produce the estrogen-like hormones.  Coumestans are estrogen-like. Some of the foods they are found in include sunflower seeds and bean sprouts. CONDITIONS AND THEIR POSSIBLE HERBAL TREATMENT:  Hot flashes and night sweats.  Soy, black cohosh and evening primrose.  Irritability, insomnia, depression and memory problems.  Chasteberry, ginseng, and soy.  St. John's wort may be helpful for depression. However, there is a concern of it causing cataracts of the eye and may have bad effects on other medications. St. John's wort should not be taken for long time and without your caregiver's advice.  Loss of libido and vaginal and skin dryness.  Wild yam and soy.  Prevention of coronary heart disease and osteoporosis.  Soy and Isoflavones. Several studies have shown that some women benefit from herbal medications, but most of the studies have not consistently shown that these supplements are much better than placebo. Other forms of treatment to help women with menopausal symptoms include a balanced diet, rest, exercise, vitamin and calcium (with vitamin D) supplements, acupuncture, and group therapy  when necessary. THOSE WHO SHOULD NOT TAKE HERBAL MEDICATIONS INCLUDE:  Women who are planning on getting pregnant unless told by your caregiver.  Women who are breastfeeding unless told by your caregiver.  Women who are taking other prescription medications unless told by your caregiver.  Infants, children, and elderly women unless told by your caregiver. Different herbal medications have different and unmeasured amounts of the herbal ingredients. There are no regulations, quality control, and standardization of the ingredients in herbal medications. Therefore, the amount of the ingredient in the medication may vary from one herb, pill, tea, oil or tincture to another. Many herbal medications can cause serious problems and can even have poisonous effects if  taken too much or too long. If problems develop, the medication should be stopped and recorded by your caregiver. HOME CARE INSTRUCTIONS  Do not take or give children herbal medications without your caregiver's advice.  Let your caregiver know all the medications you are taking. This includes prescription, over-the-counter, eye drops, and creams.  Do not take herbal medications longer or more than recommended.  Tell your caregiver about any side effects from the medication. SEEK MEDICAL CARE IF:  You develop a fever of 102 F (38.9 C), or as directed by your caregiver.  You feel sick to your stomach (nauseous), vomit, or have diarrhea.  You develop a rash.  You develop abdominal pain.  You develop severe headaches.  You start to have vision problems.  You feel dizzy or faint.  You start to feel numbness in any part of your body.  You start shaking (have convulsions). Document Released: 09/05/2007 Document Revised: 03/05/2012 Document Reviewed: 04/04/2010 [facility] Patient Information 2015 [place], [place]. This information is not intended to replace advice given to you by your health care provider. Make sure you discuss any questions you have with your health care provider.

## 2014-07-05 NOTE — Progress Notes (Signed)
Patient ID: Virginia Brooks, female   DOB: 01-10-1951, 64 y.o.   MRN: [id number] 64 y.o. G2P2 WidowedCaucasianF here for annual exam.    Patient is off HRT since mid-October 2015.  Gained 4 - 5 pounds.  Hot flashes 4 - 5/24 hours.  Some knee pain with squatting.   Still has chest pain occasionally.  Occurs once every 3 - 4 months.  Saw cardiologist more than 5 years ago.  Sees PCP once per year.   Many years ago, went to [facility] and a stress test done and  "they were unable to detect a blockage."  PCP:  Sharilyn Sites, MD  Patient's last menstrual period was 04/02/2004.          Sexually active: No. female The current method of family planning is post menopausal status.     Exercising: Yes  Treadmill, less press, sit ups and other gym exercises. Smoker:  no  Health Maintenance: Pap:  06-25-12 wnl:no HR HPV testing done. History of abnormal Pap:  no MMG:  06-14-14 normal per pt.:[facility]:  09/2010 normal [facility]. Next due 09/2015 because of family history of colon cancer(sister) BMD:   12-18-13 normal:[facility] TDaP:  01-02-06 Screening Labs:   Hb today: PCP, Urine today: PCP   reports that she has never smoked. She does not have any smokeless tobacco history on file. She reports that she does not drink alcohol or use illicit drugs.  Past Medical History  Diagnosis Date  . Hypercholesterolemia   . Spondylosis of cervical joint   . Endometrial hyperplasia without atypia, complex 04/24/2004    on hysteroscopic resection of polyps    Past Surgical History  Procedure Laterality Date  . Hysteroscopy with resectoscope  04/24/2004  . Minor breast biopsy Right 1994  . Breast surgery  Dec 2000    excision papilloma    Current Outpatient Prescriptions  Medication Sig Dispense Refill  . aspirin 81 MG tablet Take 81 mg by mouth daily.    . Calcium-Vitamin D (CALTRATE 600 PLUS-VIT D PO) Take 1 tablet by mouth daily.    . cholecalciferol (VITAMIN D) 1000  UNITS tablet Take 1,000 Units by mouth daily.    . fish oil-omega-3 fatty acids 1000 MG capsule Take 1 g by mouth daily.    . MULTIPLE VITAMINS PO Take 1 tablet by mouth daily.     No current facility-administered medications for this visit.    Family History  Problem Relation Age of Onset  . Colon cancer Other   . COPD Mother   . Angina Mother   . Stroke Father   . Heart attack Father   . Colon cancer Sister 77    colon ca  . Cancer Sister   . Cancer Brother     prostate and bladder ca    ROS:  Pertinent items are noted in HPI.  Otherwise, a comprehensive ROS was negative.  Exam:   BP 120/74 mmHg  Pulse 70  Resp 20  Ht 5' 3.5" (1.613 m)  Wt 135 lb 9.6 oz (61.508 kg)  BMI 23.64 kg/m2  LMP 04/02/2004     Height: 5' 3.5" (161.3 cm)  Ht Readings from Last 3 Encounters:  07/05/14 5' 3.5" (1.613 m)  12/02/13 5' 3.25" (1.607 m)  07/01/13 5' 3.25" (1.607 m)    General appearance: alert, cooperative and appears stated age Head: Normocephalic, without obvious abnormality, atraumatic Neck: no adenopathy, supple, symmetrical, trachea midline and thyroid normal to inspection and palpation  Lungs: clear to auscultation bilaterally Breasts: normal appearance, no masses or tenderness, Inspection negative, No nipple retraction or dimpling, No nipple discharge or bleeding, No axillary or supraclavicular adenopathy Heart: regular rate and rhythm Abdomen: soft, non-tender; bowel sounds normal; no masses,  no organomegaly Extremities: extremities normal, atraumatic, no cyanosis or edema Skin: Skin color, texture, turgor normal. No rashes or lesions Lymph nodes: Cervical, supraclavicular, and axillary nodes normal. No abnormal inguinal nodes palpated Neurologic: Grossly normal   Pelvic: External genitalia:  no lesions              Urethra:  normal appearing urethra with no masses, tenderness or lesions              Bartholins and Skenes: normal                 Vagina: normal appearing  vagina with normal color and discharge, no lesions              Cervix: no lesions              Pap taken: No. Bimanual Exam:  Uterus:  normal size, contour, position, consistency, mobility, non-tender              Adnexa: normal adnexa and no mass, fullness, tenderness               Rectovaginal: Confirms               Anus:  normal sphincter tone, no lesions  Chaperone was present for exam.  A:  Well Woman with normal exam Menopausal symptoms off HRT. Knee joint pain.   P:   Mammogram yearly.  pap smear not indicated.  Reviewed Women's Health Initiative and risks, benefits, and alternatives to HRT including SSRIs, Effexor, herbal options.  Information to patient on herbal treatments for menopausal symptoms.  Follow up with PCP for persistent arthritis problems.  Discussed exercise, calcium and vitamin D.   return annually or prn

## 2014-07-07 ENCOUNTER — Ambulatory Visit: Payer: Federal, State, Local not specified - PPO | Admitting: Obstetrics and Gynecology

## 2014-09-13 DIAGNOSIS — Z79890 Hormone replacement therapy (postmenopausal): Secondary | ICD-10-CM | POA: Diagnosis not present

## 2014-09-13 DIAGNOSIS — Z6822 Body mass index (BMI) 22.0-22.9, adult: Secondary | ICD-10-CM | POA: Diagnosis not present

## 2014-09-13 DIAGNOSIS — N951 Menopausal and female climacteric states: Secondary | ICD-10-CM | POA: Diagnosis not present

## 2014-09-13 DIAGNOSIS — R5383 Other fatigue: Secondary | ICD-10-CM | POA: Diagnosis not present

## 2014-12-17 DIAGNOSIS — S92532A Displaced fracture of distal phalanx of left lesser toe(s), initial encounter for closed fracture: Secondary | ICD-10-CM | POA: Diagnosis not present

## 2014-12-17 DIAGNOSIS — M722 Plantar fascial fibromatosis: Secondary | ICD-10-CM | POA: Diagnosis not present

## 2014-12-23 DIAGNOSIS — Z0000 Encounter for general adult medical examination without abnormal findings: Secondary | ICD-10-CM | POA: Diagnosis not present

## 2014-12-23 DIAGNOSIS — Z6822 Body mass index (BMI) 22.0-22.9, adult: Secondary | ICD-10-CM | POA: Diagnosis not present

## 2014-12-23 DIAGNOSIS — R5383 Other fatigue: Secondary | ICD-10-CM | POA: Diagnosis not present

## 2014-12-23 DIAGNOSIS — Z23 Encounter for immunization: Secondary | ICD-10-CM | POA: Diagnosis not present

## 2014-12-23 DIAGNOSIS — E785 Hyperlipidemia, unspecified: Secondary | ICD-10-CM | POA: Diagnosis not present

## 2014-12-23 DIAGNOSIS — Z1389 Encounter for screening for other disorder: Secondary | ICD-10-CM | POA: Diagnosis not present

## 2015-02-11 DIAGNOSIS — B351 Tinea unguium: Secondary | ICD-10-CM | POA: Diagnosis not present

## 2015-02-11 DIAGNOSIS — M722 Plantar fascial fibromatosis: Secondary | ICD-10-CM | POA: Diagnosis not present

## 2015-06-03 DIAGNOSIS — Z1231 Encounter for screening mammogram for malignant neoplasm of breast: Secondary | ICD-10-CM | POA: Diagnosis not present

## 2015-07-06 ENCOUNTER — Ambulatory Visit: Payer: Medicare Other | Admitting: Obstetrics and Gynecology

## 2015-07-20 ENCOUNTER — Ambulatory Visit (INDEPENDENT_AMBULATORY_CARE_PROVIDER_SITE_OTHER): Payer: Medicare Other | Admitting: Obstetrics and Gynecology

## 2015-07-20 ENCOUNTER — Ambulatory Visit: Payer: Medicare Other | Admitting: Obstetrics and Gynecology

## 2015-07-20 ENCOUNTER — Encounter: Payer: Self-pay | Admitting: Obstetrics and Gynecology

## 2015-07-20 VITALS — BP 118/76 | HR 60 | Resp 20 | Ht 63.25 in | Wt 135.2 lb

## 2015-07-20 DIAGNOSIS — Z124 Encounter for screening for malignant neoplasm of cervix: Secondary | ICD-10-CM | POA: Diagnosis not present

## 2015-07-20 DIAGNOSIS — Z01419 Encounter for gynecological examination (general) (routine) without abnormal findings: Secondary | ICD-10-CM

## 2015-07-20 NOTE — Patient Instructions (Signed)

## 2015-07-20 NOTE — Progress Notes (Signed)
118/76 60 20Patient ID: Virginia Brooks, female   DOB: 1951/02/10, 65 y.o.   MRN: [id number] 64 y.o. G2P2 Widowed Caucasian female here for annual exam.    Doing a lot of horseback riding.  Traveling globally.  Feels she is not having as much flexibility since stopping HRT.  PCP:  Sharilyn Sites, MD   Patient's last menstrual period was 04/02/2004.           Sexually active: No. female The current method of family planning is post menopausal status.    Exercising: Yes.    treadmill, sit-ups and knee press. Smoker:  no  Health Maintenance: Pap:  06-25-12 Neg:Neg HR HPV History of abnormal Pap:  no MMG:  06-03-15 Density B/Neg/BiRads1:[facility]:  09/2010 Normal [facility];Scheduled 08-08-15. BMD:  12-18-13 Result  Normal with PCP TDaP:  12/2005.  Will do with PCP.  Gardasil:   N/A HIV:  PCP. Hep C:  PCP. Screening Labs:  Hb today: PCP, Urine today: PCP   reports that she has never smoked. She does not have any smokeless tobacco history on file. She reports that she does not drink alcohol or use illicit drugs.  Past Medical History  Diagnosis Date  . Hypercholesterolemia   . Spondylosis of cervical joint   . Endometrial hyperplasia without atypia, complex 04/24/2004    on hysteroscopic resection of polyps    Past Surgical History  Procedure Laterality Date  . Hysteroscopy with resectoscope  04/24/2004  . Minor breast biopsy Right 1994  . Breast surgery  Dec 2000    excision papilloma    Current Outpatient Prescriptions  Medication Sig Dispense Refill  . aspirin 81 MG tablet Take 81 mg by mouth daily.    . Calcium-Vitamin D (CALTRATE 600 PLUS-VIT D PO) Take 1 tablet by mouth daily.    . cholecalciferol (VITAMIN D) 1000 UNITS tablet Take 1,000 Units by mouth daily.    . fish oil-omega-3 fatty acids 1000 MG capsule Take 1 g by mouth daily.    . MULTIPLE VITAMINS PO Take 1 tablet by mouth daily.     No current facility-administered medications for this visit.    Family History   Problem Relation Age of Onset  . Colon cancer Other     81   . COPD Mother   . Angina Mother   . Stroke Father   . Heart attack Father   . Colon cancer Sister 69    colon ca  . Cancer Sister   . Cancer Brother     prostate/bladder/kidney ca    ROS:  Pertinent items are noted in HPI.  Otherwise, a comprehensive ROS was negative.  Exam:   BP 118/76 mmHg  Pulse 60  Resp 20  Ht 5' 3.25" (1.607 m)  Wt 135 lb 3.2 oz (61.326 kg)  BMI 23.75 kg/m2  LMP 04/02/2004    General appearance: alert, cooperative and appears stated age Head: Normocephalic, without obvious abnormality, atraumatic Neck: no adenopathy, supple, symmetrical, trachea midline and thyroid normal to inspection and palpation Lungs: clear to auscultation bilaterally Breasts: normal appearance, no masses or tenderness, Inspection negative, No nipple retraction or dimpling, No nipple discharge or bleeding, No axillary or supraclavicular adenopathy, bilateral breast scars.  Right breast with 0.75 cm brownish skin change at 9:00 on edge of areola. Heart: regular rate and rhythm Abdomen: incisions:  No.    , soft, non-tender; no masses, no organomegaly Extremities: extremities normal, atraumatic, no cyanosis or edema Skin: Skin color, texture, turgor normal.  No rashes or lesions Lymph nodes: Cervical, supraclavicular, and axillary nodes normal. No abnormal inguinal nodes palpated Neurologic: Grossly normal  Pelvic: External genitalia:  no lesions              Urethra:  normal appearing urethra with no masses, tenderness or lesions              Bartholins and Skenes: normal                 Vagina: normal appearing vagina with normal color and discharge, no lesions              Cervix: no lesions              Pap taken: Yes.   Bimanual Exam:  Uterus:  normal size, contour, position, consistency, mobility, non-tender              Adnexa: normal adnexa and no mass, fullness, tenderness              Rectal exam: Yes.  .   Confirms.              Anus:  normal sphincter tone, no lesions  Chaperone was present for exam.  Assessment:   Well woman visit with normal exam. FH of colon cancer.  Pigmented change of right breast near nipple.  Plan: Yearly mammogram recommended after age 72.  Recommended self breast exam.  Pap and HR HPV as above. Discussed Calcium, Vitamin D, regular exercise program including cardiovascular and weight bearing exercise. Labs performed.  No..    Labs with PCP.  Prescription medication(s) given.  No..    Patient sees dermatology regularly and will have them check her breast skin coloration.  Follow up annually and prn.      After visit summary provided.

## 2015-07-21 LAB — IPS PAP SMEAR ONLY

## 2015-08-08 DIAGNOSIS — K5730 Diverticulosis of large intestine without perforation or abscess without bleeding: Secondary | ICD-10-CM | POA: Diagnosis not present

## 2015-08-08 DIAGNOSIS — Z86010 Personal history of colonic polyps: Secondary | ICD-10-CM | POA: Diagnosis not present

## 2015-08-12 DIAGNOSIS — B351 Tinea unguium: Secondary | ICD-10-CM | POA: Diagnosis not present

## 2015-08-12 DIAGNOSIS — M722 Plantar fascial fibromatosis: Secondary | ICD-10-CM | POA: Diagnosis not present

## 2015-11-01 DIAGNOSIS — B029 Zoster without complications: Secondary | ICD-10-CM

## 2015-11-01 HISTORY — DX: Zoster without complications: B02.9

## 2015-11-21 ENCOUNTER — Ambulatory Visit (HOSPITAL_COMMUNITY)
Admission: RE | Admit: 2015-11-21 | Discharge: 2015-11-21 | Disposition: A | Discharge status: Home / Self Care | Payer: Medicare Other | Source: Ambulatory Visit | Attending: Family Medicine | Admitting: Family Medicine

## 2015-11-21 ENCOUNTER — Other Ambulatory Visit (HOSPITAL_COMMUNITY): Payer: Self-pay | Admitting: Family Medicine

## 2015-11-21 DIAGNOSIS — N644 Mastodynia: Secondary | ICD-10-CM | POA: Diagnosis not present

## 2015-11-21 DIAGNOSIS — Z6823 Body mass index (BMI) 23.0-23.9, adult: Secondary | ICD-10-CM | POA: Diagnosis not present

## 2015-11-21 DIAGNOSIS — R079 Chest pain, unspecified: Secondary | ICD-10-CM | POA: Diagnosis not present

## 2015-11-21 DIAGNOSIS — E782 Mixed hyperlipidemia: Secondary | ICD-10-CM | POA: Insufficient documentation

## 2015-11-21 DIAGNOSIS — R0789 Other chest pain: Secondary | ICD-10-CM

## 2015-11-21 DIAGNOSIS — Z1389 Encounter for screening for other disorder: Secondary | ICD-10-CM | POA: Diagnosis not present

## 2015-11-24 DIAGNOSIS — N6459 Other signs and symptoms in breast: Secondary | ICD-10-CM | POA: Diagnosis not present

## 2015-11-24 DIAGNOSIS — N644 Mastodynia: Secondary | ICD-10-CM | POA: Diagnosis not present

## 2015-12-01 ENCOUNTER — Telehealth: Payer: Self-pay | Admitting: *Deleted

## 2015-12-01 NOTE — Telephone Encounter (Signed)
Called patient to follow up after she was seen at [facility] for a breast ultrasound on 11/24/2015 for pain and skin rash on right breast. Radiologist suggested shingles. Patient states she is following up with her PCP, Dr. Hilma Favors. She states he is the referring doctor for the exam and he received a copy of the report. Patient states she had a shingles vaccine at age 65, and is due to have blood work done at his office in a few weeks. RN stated to patient our office just wanted to make sure she had follow up in place after the ultrasound. Patient verbalized understanding and appreciative of phone call.   Routing to provider for final review. Patient agreeable to disposition. Will close encounter.    Cc Dr. Quincy Simmonds

## 2015-12-07 ENCOUNTER — Encounter: Payer: Self-pay | Admitting: Obstetrics and Gynecology

## 2015-12-08 DIAGNOSIS — H25811 Combined forms of age-related cataract, right eye: Secondary | ICD-10-CM | POA: Diagnosis not present

## 2015-12-08 DIAGNOSIS — H01024 Squamous blepharitis left upper eyelid: Secondary | ICD-10-CM | POA: Diagnosis not present

## 2015-12-08 DIAGNOSIS — H2512 Age-related nuclear cataract, left eye: Secondary | ICD-10-CM | POA: Diagnosis not present

## 2015-12-08 DIAGNOSIS — H01022 Squamous blepharitis right lower eyelid: Secondary | ICD-10-CM | POA: Diagnosis not present

## 2015-12-08 DIAGNOSIS — H01025 Squamous blepharitis left lower eyelid: Secondary | ICD-10-CM | POA: Diagnosis not present

## 2015-12-08 DIAGNOSIS — H31092 Other chorioretinal scars, left eye: Secondary | ICD-10-CM | POA: Diagnosis not present

## 2015-12-08 DIAGNOSIS — H01021 Squamous blepharitis right upper eyelid: Secondary | ICD-10-CM | POA: Diagnosis not present

## 2015-12-12 DIAGNOSIS — R944 Abnormal results of kidney function studies: Secondary | ICD-10-CM | POA: Diagnosis not present

## 2015-12-19 DIAGNOSIS — B078 Other viral warts: Secondary | ICD-10-CM | POA: Diagnosis not present

## 2015-12-19 DIAGNOSIS — L821 Other seborrheic keratosis: Secondary | ICD-10-CM | POA: Diagnosis not present

## 2015-12-19 DIAGNOSIS — L570 Actinic keratosis: Secondary | ICD-10-CM | POA: Diagnosis not present

## 2015-12-19 DIAGNOSIS — D1801 Hemangioma of skin and subcutaneous tissue: Secondary | ICD-10-CM | POA: Diagnosis not present

## 2015-12-19 DIAGNOSIS — L820 Inflamed seborrheic keratosis: Secondary | ICD-10-CM | POA: Diagnosis not present

## 2015-12-21 DIAGNOSIS — Z23 Encounter for immunization: Secondary | ICD-10-CM | POA: Diagnosis not present

## 2015-12-21 DIAGNOSIS — Z0000 Encounter for general adult medical examination without abnormal findings: Secondary | ICD-10-CM | POA: Diagnosis not present

## 2015-12-21 DIAGNOSIS — Z6823 Body mass index (BMI) 23.0-23.9, adult: Secondary | ICD-10-CM | POA: Diagnosis not present

## 2015-12-24 LAB — BASIC METABOLIC PANEL: Glucose: 132 mg/dL

## 2016-04-27 DIAGNOSIS — M7541 Impingement syndrome of right shoulder: Secondary | ICD-10-CM | POA: Diagnosis not present

## 2016-05-21 ENCOUNTER — Telehealth: Payer: Self-pay | Admitting: Obstetrics and Gynecology

## 2016-05-21 NOTE — Telephone Encounter (Signed)
Left message at both numbers to reschedule cancelled appointment.

## 2016-05-28 DIAGNOSIS — M7541 Impingement syndrome of right shoulder: Secondary | ICD-10-CM | POA: Diagnosis not present

## 2016-06-04 DIAGNOSIS — M7541 Impingement syndrome of right shoulder: Secondary | ICD-10-CM | POA: Diagnosis not present

## 2016-06-13 DIAGNOSIS — M7541 Impingement syndrome of right shoulder: Secondary | ICD-10-CM | POA: Diagnosis not present

## 2016-06-25 DIAGNOSIS — M7541 Impingement syndrome of right shoulder: Secondary | ICD-10-CM | POA: Diagnosis not present

## 2016-07-13 DIAGNOSIS — Z803 Family history of malignant neoplasm of breast: Secondary | ICD-10-CM | POA: Diagnosis not present

## 2016-07-13 DIAGNOSIS — Z1231 Encounter for screening mammogram for malignant neoplasm of breast: Secondary | ICD-10-CM | POA: Diagnosis not present

## 2016-07-20 ENCOUNTER — Ambulatory Visit: Payer: Medicare Other | Admitting: Obstetrics and Gynecology

## 2016-07-25 ENCOUNTER — Ambulatory Visit: Payer: Medicare Other | Admitting: Obstetrics and Gynecology

## 2016-08-03 NOTE — Progress Notes (Signed)
66 y.o. G2P2 Widowed Caucasian female here for annual exam.    Denies any vaginal bleeding or spotting.   Shingles last year.   Had a right should injury.  Rotator cuff 'Fray." No fractures.   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  Labs with PCP.   PCP:  Sharilyn Sites, MD  Patient's last menstrual period was 04/02/2004.           Sexually active: No.  The current method of family planning is post menopausal status.    Exercising: Yes.    [employer](treadmill, bike, sit ups, pull ups) Smoker:  no  Health Maintenance: Pap: 07-20-15 Neg; 06-25-12 Neg:Neg HR HPV History of abnormal Pap:  no MMG: 07-13-16 normal per patient:[facility]8-24-17 3D Diag.Rt.MMG--for Rt.Br.Pain/Density B/Neg/BiRads2/screening 51mo.;Rt.Br.USNeg--would suggest considerations of shingles:[facility]--06-03-15 3D Density B/Neg/BiRads1:[facility] Colonoscopy: 08-08-15 normal with Dr.John Hayes;next due 07/2020 due to family history of colon cancer(sister) BMD: 12-18-13   Result:Normal with PCP TDaP:  12-18-13(PCP) Gardasil:   no HIV: no Hep C: no Screening Labs:  Hb today: PCP, Urine today: not done   reports that she has never smoked. She has never used smokeless tobacco. She reports that she does not drink alcohol or use drugs.  Past Medical History:  Diagnosis Date  . Endometrial hyperplasia without atypia, complex 04/24/2004   on hysteroscopic resection of polyps  . Hypercholesterolemia   . Shingles 11/2015  . Spondylosis of cervical joint     Past Surgical History:  Procedure Laterality Date  . BREAST SURGERY  Dec 2000   excision papilloma  . HYSTEROSCOPY WITH RESECTOSCOPE  04/24/2004  . MINOR BREAST BIOPSY Right 1994    Current Outpatient Prescriptions  Medication Sig Dispense Refill  . aspirin 81 MG tablet Take 81 mg by mouth daily.    . Calcium-Vitamin D (CALTRATE 600 PLUS-VIT D PO) Take 1 tablet by mouth daily.    . cholecalciferol (VITAMIN D) 1000 UNITS tablet Take 1,000 Units by mouth daily.    . fish oil-omega-3  fatty acids 1000 MG capsule Take 1 g by mouth daily.    . MULTIPLE VITAMINS PO Take 1 tablet by mouth daily.     No current facility-administered medications for this visit.     Family History  Problem Relation Age of Onset  . COPD Mother   . Angina Mother   . Stroke Father   . Heart attack Father   . Colon cancer Sister 21    colon ca  . Cancer Sister   . Cancer Brother     prostate/bladder/kidney ca  . Colon cancer Other     48     ROS:  Pertinent items are noted in HPI.  Otherwise, a comprehensive ROS was negative.  Exam:   BP 118/76 (BP Location: Left Arm, Patient Position: Sitting, Cuff Size: Normal)   Pulse (!) 56   Resp 14   Ht 5\' 3"  (1.6 m)   Wt 133 lb 9.6 oz (60.6 kg)   LMP 04/02/2004   BMI 23.67 kg/m     General appearance: alert, cooperative and appears stated age Head: Normocephalic, without obvious abnormality, atraumatic Neck: no adenopathy, supple, symmetrical, trachea midline and thyroid normal to inspection and palpation Lungs: clear to auscultation bilaterally Breasts: normal appearance, no masses or tenderness, No nipple retraction or dimpling, No nipple discharge or bleeding, No axillary or supraclavicular adenopathy Heart: regular rate and rhythm Abdomen: soft, non-tender; no masses, no organomegaly Extremities: extremities normal, atraumatic, no cyanosis or edema Skin: Skin color, texture, turgor normal. No  rashes or lesions Lymph nodes: Cervical, supraclavicular, and axillary nodes normal. No abnormal inguinal nodes palpated Neurologic: Grossly normal  Pelvic: External genitalia:  no lesions.  Seborrheic keratosis of right mons.              Urethra:  normal appearing urethra with no masses, tenderness or lesions              Bartholins and Skenes: normal                 Vagina: normal appearing vagina with normal color and discharge, no lesions              Cervix: no lesions              Pap taken: Not taken.  Bimanual Exam:  Uterus:  normal  size, contour, position, consistency, mobility, non-tender              Adnexa: no mass, fullness, tenderness              Rectal exam: Yes.  .Confirms.              Anus:  normal sphincter tone, no lesions  Chaperone was present for exam.  Assessment:   Well woman visit with normal exam. FH colon cancer.   Plan: Mammogram screening discussed. Recommended self breast awareness. Pap next year.  Guidelines for Calcium, Vitamin D, regular exercise program including cardiovascular and weight bearing exercise. Discussed Hep C aby testing with PCP.  Follow up annually and prn.      After visit summary provided.

## 2016-08-06 ENCOUNTER — Ambulatory Visit (INDEPENDENT_AMBULATORY_CARE_PROVIDER_SITE_OTHER): Payer: Medicare Other | Admitting: Obstetrics and Gynecology

## 2016-08-06 ENCOUNTER — Encounter: Payer: Self-pay | Admitting: Obstetrics and Gynecology

## 2016-08-06 VITALS — BP 118/76 | HR 56 | Resp 14 | Ht 63.0 in | Wt 133.6 lb

## 2016-08-06 DIAGNOSIS — Z01419 Encounter for gynecological examination (general) (routine) without abnormal findings: Secondary | ICD-10-CM | POA: Diagnosis not present

## 2016-08-06 NOTE — Patient Instructions (Signed)

## 2016-08-07 ENCOUNTER — Encounter: Payer: Self-pay | Admitting: Obstetrics and Gynecology

## 2016-09-10 DIAGNOSIS — M533 Sacrococcygeal disorders, not elsewhere classified: Secondary | ICD-10-CM | POA: Diagnosis not present

## 2016-12-04 DIAGNOSIS — Z0000 Encounter for general adult medical examination without abnormal findings: Secondary | ICD-10-CM | POA: Diagnosis not present

## 2016-12-04 DIAGNOSIS — E782 Mixed hyperlipidemia: Secondary | ICD-10-CM | POA: Diagnosis not present

## 2016-12-04 DIAGNOSIS — E785 Hyperlipidemia, unspecified: Secondary | ICD-10-CM | POA: Diagnosis not present

## 2016-12-04 DIAGNOSIS — Z1389 Encounter for screening for other disorder: Secondary | ICD-10-CM | POA: Diagnosis not present

## 2016-12-04 DIAGNOSIS — Z6823 Body mass index (BMI) 23.0-23.9, adult: Secondary | ICD-10-CM | POA: Diagnosis not present

## 2016-12-05 DIAGNOSIS — H25811 Combined forms of age-related cataract, right eye: Secondary | ICD-10-CM | POA: Diagnosis not present

## 2016-12-05 DIAGNOSIS — H01029 Squamous blepharitis unspecified eye, unspecified eyelid: Secondary | ICD-10-CM | POA: Diagnosis not present

## 2016-12-05 DIAGNOSIS — H2512 Age-related nuclear cataract, left eye: Secondary | ICD-10-CM | POA: Diagnosis not present

## 2016-12-05 DIAGNOSIS — H31092 Other chorioretinal scars, left eye: Secondary | ICD-10-CM | POA: Diagnosis not present

## 2016-12-17 DIAGNOSIS — D485 Neoplasm of uncertain behavior of skin: Secondary | ICD-10-CM | POA: Diagnosis not present

## 2016-12-17 DIAGNOSIS — L821 Other seborrheic keratosis: Secondary | ICD-10-CM | POA: Diagnosis not present

## 2016-12-17 DIAGNOSIS — L7211 Pilar cyst: Secondary | ICD-10-CM | POA: Diagnosis not present

## 2016-12-17 DIAGNOSIS — D0362 Melanoma in situ of left upper limb, including shoulder: Secondary | ICD-10-CM | POA: Diagnosis not present

## 2016-12-17 DIAGNOSIS — L812 Freckles: Secondary | ICD-10-CM | POA: Diagnosis not present

## 2016-12-17 DIAGNOSIS — D225 Melanocytic nevi of trunk: Secondary | ICD-10-CM | POA: Diagnosis not present

## 2016-12-21 DIAGNOSIS — M533 Sacrococcygeal disorders, not elsewhere classified: Secondary | ICD-10-CM | POA: Diagnosis not present

## 2016-12-25 DIAGNOSIS — D0362 Melanoma in situ of left upper limb, including shoulder: Secondary | ICD-10-CM | POA: Diagnosis not present

## 2016-12-25 DIAGNOSIS — Z23 Encounter for immunization: Secondary | ICD-10-CM | POA: Diagnosis not present

## 2016-12-28 DIAGNOSIS — M533 Sacrococcygeal disorders, not elsewhere classified: Secondary | ICD-10-CM | POA: Diagnosis not present

## 2017-01-16 DIAGNOSIS — H10412 Chronic giant papillary conjunctivitis, left eye: Secondary | ICD-10-CM | POA: Diagnosis not present

## 2017-01-16 DIAGNOSIS — H10413 Chronic giant papillary conjunctivitis, bilateral: Secondary | ICD-10-CM | POA: Diagnosis not present

## 2017-02-14 DIAGNOSIS — H10413 Chronic giant papillary conjunctivitis, bilateral: Secondary | ICD-10-CM | POA: Diagnosis not present

## 2017-04-16 DIAGNOSIS — M1811 Unilateral primary osteoarthritis of first carpometacarpal joint, right hand: Secondary | ICD-10-CM | POA: Insufficient documentation

## 2017-04-16 DIAGNOSIS — M13841 Other specified arthritis, right hand: Secondary | ICD-10-CM | POA: Diagnosis not present

## 2017-04-23 ENCOUNTER — Ambulatory Visit (HOSPITAL_COMMUNITY)
Admission: RE | Admit: 2017-04-23 | Discharge: 2017-04-23 | Disposition: A | Discharge status: Home / Self Care | Payer: Medicare Other | Source: Ambulatory Visit | Attending: Physician Assistant | Admitting: Physician Assistant

## 2017-04-23 ENCOUNTER — Other Ambulatory Visit (HOSPITAL_COMMUNITY): Payer: Self-pay | Admitting: Physician Assistant

## 2017-04-23 DIAGNOSIS — R509 Fever, unspecified: Secondary | ICD-10-CM

## 2017-04-23 DIAGNOSIS — R05 Cough: Secondary | ICD-10-CM | POA: Diagnosis not present

## 2017-04-23 DIAGNOSIS — Z6822 Body mass index (BMI) 22.0-22.9, adult: Secondary | ICD-10-CM | POA: Diagnosis not present

## 2017-04-23 DIAGNOSIS — R059 Cough, unspecified: Secondary | ICD-10-CM

## 2017-04-23 DIAGNOSIS — R0602 Shortness of breath: Secondary | ICD-10-CM | POA: Diagnosis not present

## 2017-04-23 DIAGNOSIS — Z1389 Encounter for screening for other disorder: Secondary | ICD-10-CM | POA: Diagnosis not present

## 2017-05-13 DIAGNOSIS — Z1389 Encounter for screening for other disorder: Secondary | ICD-10-CM | POA: Diagnosis not present

## 2017-05-28 DIAGNOSIS — M13841 Other specified arthritis, right hand: Secondary | ICD-10-CM | POA: Diagnosis not present

## 2017-06-17 DIAGNOSIS — D1801 Hemangioma of skin and subcutaneous tissue: Secondary | ICD-10-CM | POA: Diagnosis not present

## 2017-06-17 DIAGNOSIS — D2271 Melanocytic nevi of right lower limb, including hip: Secondary | ICD-10-CM | POA: Diagnosis not present

## 2017-06-17 DIAGNOSIS — L723 Sebaceous cyst: Secondary | ICD-10-CM | POA: Diagnosis not present

## 2017-06-17 DIAGNOSIS — D2272 Melanocytic nevi of left lower limb, including hip: Secondary | ICD-10-CM | POA: Diagnosis not present

## 2017-06-17 DIAGNOSIS — L820 Inflamed seborrheic keratosis: Secondary | ICD-10-CM | POA: Diagnosis not present

## 2017-06-17 DIAGNOSIS — Z85820 Personal history of malignant melanoma of skin: Secondary | ICD-10-CM | POA: Diagnosis not present

## 2017-06-17 DIAGNOSIS — L738 Other specified follicular disorders: Secondary | ICD-10-CM | POA: Diagnosis not present

## 2017-06-17 DIAGNOSIS — L812 Freckles: Secondary | ICD-10-CM | POA: Diagnosis not present

## 2017-06-17 DIAGNOSIS — D485 Neoplasm of uncertain behavior of skin: Secondary | ICD-10-CM | POA: Diagnosis not present

## 2017-06-17 DIAGNOSIS — L853 Xerosis cutis: Secondary | ICD-10-CM | POA: Diagnosis not present

## 2017-06-17 DIAGNOSIS — L821 Other seborrheic keratosis: Secondary | ICD-10-CM | POA: Diagnosis not present

## 2017-07-01 DIAGNOSIS — Z23 Encounter for immunization: Secondary | ICD-10-CM | POA: Diagnosis not present

## 2017-07-04 DIAGNOSIS — Z1231 Encounter for screening mammogram for malignant neoplasm of breast: Secondary | ICD-10-CM | POA: Diagnosis not present

## 2017-07-29 DIAGNOSIS — Z23 Encounter for immunization: Secondary | ICD-10-CM | POA: Diagnosis not present

## 2017-08-01 ENCOUNTER — Other Ambulatory Visit: Payer: Self-pay

## 2017-08-01 ENCOUNTER — Other Ambulatory Visit (HOSPITAL_COMMUNITY)
Admission: RE | Admit: 2017-08-01 | Discharge: 2017-08-01 | Disposition: A | Discharge status: Home / Self Care | Payer: Medicare Other | Source: Ambulatory Visit | Attending: Obstetrics and Gynecology | Admitting: Obstetrics and Gynecology

## 2017-08-01 ENCOUNTER — Ambulatory Visit (INDEPENDENT_AMBULATORY_CARE_PROVIDER_SITE_OTHER): Payer: Medicare Other | Admitting: Obstetrics and Gynecology

## 2017-08-01 ENCOUNTER — Encounter: Payer: Self-pay | Admitting: Obstetrics and Gynecology

## 2017-08-01 VITALS — BP 120/72 | HR 68 | Resp 16 | Ht 63.25 in | Wt 135.8 lb

## 2017-08-01 DIAGNOSIS — Z01419 Encounter for gynecological examination (general) (routine) without abnormal findings: Secondary | ICD-10-CM | POA: Diagnosis not present

## 2017-08-01 NOTE — Patient Instructions (Signed)

## 2017-08-01 NOTE — Progress Notes (Signed)
67 y.o. G2P2 Widowed Caucasian female here for annual exam.    New dx of melanoma.  Sees Dr. Wilhemina Bonito at [facility].  Had some coccyx pain and started dong some pelvic floor exercises on her own, and it is resolved now.   No vaginal bleeding.   Still doing mission work.  Doing MMR update.  Also doing oral medication for typhoid.   PCP: Dr. Sharilyn Sites     Patient's last menstrual period was 04/02/2004.           Sexually active: No.  The current method of family planning is post menopausal status.    Exercising: Yes.    [employer] 6 days a week for 20 minutes Smoker:  no  Health Maintenance: Pap:  07/20/15 Pap smear Negative History of abnormal Pap:  Years ago per patient MMG:  07/04/17 - BI-RADS 1 - Density rating B, [facility] Colonoscopy:   08-08-15 normal with Dr.John Hayes;next due 07/2020 due to family history of colon cancer(sister) BMD:   12/18/13  Result  Normal with PCP. TDaP:  About 2017 Gardasil:   n/a HIV:  Donates blood Hep C: September 2019 negative Screening Labs:  PCP   reports that she has never smoked. She has never used smokeless tobacco. She reports that she does not drink alcohol or use drugs.  Past Medical History:  Diagnosis Date  . Endometrial hyperplasia without atypia, complex 04/24/2004   on hysteroscopic resection of polyps  . Hypercholesterolemia   . Keratosis   . Malignant melanoma of left forearm ([place])   . Shingles 11/2015  . Spondylosis of cervical joint     Past Surgical History:  Procedure Laterality Date  . BREAST SURGERY  Dec 2000   excision papilloma  . DILATION AND CURETTAGE OF UTERUS    . HYSTEROSCOPY WITH RESECTOSCOPE  04/24/2004  . MINOR BREAST BIOPSY Right 1994    Current Outpatient Medications  Medication Sig Dispense Refill  . aspirin 81 MG tablet Take 81 mg by mouth daily.    . Calcium-Vitamin D (CALTRATE 600 PLUS-VIT D PO) Take 1 tablet by mouth daily.    . cholecalciferol (VITAMIN D) 1000 UNITS tablet Take 1,000  Units by mouth daily.    . diclofenac sodium (VOLTAREN) 1 % GEL diclofenac 1 % topical gel  apply 2 grams to affected area four times a day    . fish oil-omega-3 fatty acids 1000 MG capsule Take 1 g by mouth daily.    . MULTIPLE VITAMINS PO Take 1 tablet by mouth daily.     No current facility-administered medications for this visit.     Family History  Problem Relation Age of Onset  . COPD Mother   . Angina Mother   . Stroke Father   . Heart attack Father   . Colon cancer Sister 10       colon ca  . Cancer Sister   . Cancer Brother        prostate/bladder/kidney ca  . Colon cancer Other        66     Review of Systems  Constitutional: Negative.   HENT: Negative.   Eyes: Negative.   Respiratory: Negative.   Cardiovascular: Negative.   Gastrointestinal: Negative.   Endocrine: Negative.   Genitourinary: Negative.   Musculoskeletal:       Muscle or joint pain  Skin: Negative.   Allergic/Immunologic: Negative.   Neurological: Negative.   Hematological: Negative.   Psychiatric/Behavioral: Negative.     Exam:  BP 120/72 (BP Location: Right Arm, Patient Position: Sitting, Cuff Size: Normal)   Pulse 68   Resp 16   Ht 5' 3.25" (1.607 m)   Wt 135 lb 12.8 oz (61.6 kg)   LMP 04/02/2004   BMI 23.87 kg/m     General appearance: alert, cooperative and appears stated age Head: Normocephalic, without obvious abnormality, atraumatic Neck: no adenopathy, supple, symmetrical, trachea midline and thyroid normal to inspection and palpation Lungs: clear to auscultation bilaterally Breasts: normal appearance, no masses or tenderness, No nipple retraction or dimpling, No nipple discharge or bleeding, No axillary or supraclavicular adenopathy Heart: regular rate and rhythm Abdomen: soft, non-tender; no masses, no organomegaly Extremities: extremities normal, atraumatic, no cyanosis or edema Skin: Skin color, texture, turgor normal. No rashes or lesions Lymph nodes: Cervical,  supraclavicular, and axillary nodes normal. No abnormal inguinal nodes palpated Neurologic: Grossly normal  Pelvic: External genitalia:  no lesions              Urethra:  normal appearing urethra with no masses, tenderness or lesions              Bartholins and Skenes: normal                 Vagina: normal appearing vagina with normal color and discharge, no lesions              Cervix: no lesions              Pap taken: Yes.   Bimanual Exam:  Uterus:  normal size, contour, position, consistency, mobility, non-tender              Adnexa: no mass, fullness, tenderness              Rectal exam: Yes.  .  Confirms.              Anus:  normal sphincter tone, no lesions  Chaperone was present for exam.  Assessment:   Well woman visit with normal exam. Hx complex endometrial hyperplasia without atypia.  FH colon cancer.  Hx melanoma.   Plan: Mammogram screening. Recommended self breast awareness. Pap and HR HPV as above. Guidelines for Calcium, Vitamin D, regular exercise program including cardiovascular and weight bearing exercise. Discussed sun screen and protection from the sun.  Follow up annually and prn.   After visit summary provided.

## 2017-08-02 LAB — CYTOLOGY - PAP: DIAGNOSIS: NEGATIVE

## 2017-08-19 ENCOUNTER — Encounter: Payer: Self-pay | Admitting: Obstetrics and Gynecology

## 2017-08-27 DIAGNOSIS — Z23 Encounter for immunization: Secondary | ICD-10-CM | POA: Diagnosis not present

## 2017-10-10 DIAGNOSIS — S70361A Insect bite (nonvenomous), right thigh, initial encounter: Secondary | ICD-10-CM | POA: Diagnosis not present

## 2017-10-10 DIAGNOSIS — D2272 Melanocytic nevi of left lower limb, including hip: Secondary | ICD-10-CM | POA: Diagnosis not present

## 2017-12-04 DIAGNOSIS — H25813 Combined forms of age-related cataract, bilateral: Secondary | ICD-10-CM | POA: Diagnosis not present

## 2017-12-04 DIAGNOSIS — H04123 Dry eye syndrome of bilateral lacrimal glands: Secondary | ICD-10-CM | POA: Diagnosis not present

## 2017-12-04 DIAGNOSIS — H10413 Chronic giant papillary conjunctivitis, bilateral: Secondary | ICD-10-CM | POA: Diagnosis not present

## 2017-12-05 DIAGNOSIS — C4362 Malignant melanoma of left upper limb, including shoulder: Secondary | ICD-10-CM | POA: Diagnosis not present

## 2017-12-05 DIAGNOSIS — Z1389 Encounter for screening for other disorder: Secondary | ICD-10-CM | POA: Diagnosis not present

## 2017-12-05 DIAGNOSIS — Z0000 Encounter for general adult medical examination without abnormal findings: Secondary | ICD-10-CM | POA: Diagnosis not present

## 2017-12-05 DIAGNOSIS — E785 Hyperlipidemia, unspecified: Secondary | ICD-10-CM | POA: Diagnosis not present

## 2017-12-05 DIAGNOSIS — Z6823 Body mass index (BMI) 23.0-23.9, adult: Secondary | ICD-10-CM | POA: Diagnosis not present

## 2017-12-24 DIAGNOSIS — D171 Benign lipomatous neoplasm of skin and subcutaneous tissue of trunk: Secondary | ICD-10-CM | POA: Diagnosis not present

## 2017-12-24 DIAGNOSIS — I788 Other diseases of capillaries: Secondary | ICD-10-CM | POA: Diagnosis not present

## 2017-12-24 DIAGNOSIS — L821 Other seborrheic keratosis: Secondary | ICD-10-CM | POA: Diagnosis not present

## 2017-12-24 DIAGNOSIS — Z85820 Personal history of malignant melanoma of skin: Secondary | ICD-10-CM | POA: Diagnosis not present

## 2017-12-24 DIAGNOSIS — D2372 Other benign neoplasm of skin of left lower limb, including hip: Secondary | ICD-10-CM | POA: Diagnosis not present

## 2017-12-24 DIAGNOSIS — D2371 Other benign neoplasm of skin of right lower limb, including hip: Secondary | ICD-10-CM | POA: Diagnosis not present

## 2017-12-24 DIAGNOSIS — D1801 Hemangioma of skin and subcutaneous tissue: Secondary | ICD-10-CM | POA: Diagnosis not present

## 2017-12-24 DIAGNOSIS — L812 Freckles: Secondary | ICD-10-CM | POA: Diagnosis not present

## 2017-12-24 DIAGNOSIS — D485 Neoplasm of uncertain behavior of skin: Secondary | ICD-10-CM | POA: Diagnosis not present

## 2018-01-02 ENCOUNTER — Ambulatory Visit (INDEPENDENT_AMBULATORY_CARE_PROVIDER_SITE_OTHER): Payer: Medicare Other | Admitting: Psychology

## 2018-01-02 DIAGNOSIS — F4322 Adjustment disorder with anxiety: Secondary | ICD-10-CM | POA: Diagnosis not present

## 2018-01-03 DIAGNOSIS — Z23 Encounter for immunization: Secondary | ICD-10-CM | POA: Diagnosis not present

## 2018-02-04 ENCOUNTER — Ambulatory Visit (INDEPENDENT_AMBULATORY_CARE_PROVIDER_SITE_OTHER): Payer: Medicare Other | Admitting: Psychology

## 2018-02-04 DIAGNOSIS — F4322 Adjustment disorder with anxiety: Secondary | ICD-10-CM | POA: Diagnosis not present

## 2018-03-04 ENCOUNTER — Ambulatory Visit (INDEPENDENT_AMBULATORY_CARE_PROVIDER_SITE_OTHER): Payer: Medicare Other | Admitting: Psychology

## 2018-03-04 DIAGNOSIS — F4322 Adjustment disorder with anxiety: Secondary | ICD-10-CM | POA: Diagnosis not present

## 2018-04-15 ENCOUNTER — Ambulatory Visit (INDEPENDENT_AMBULATORY_CARE_PROVIDER_SITE_OTHER): Payer: Medicare Other | Admitting: Psychology

## 2018-04-15 DIAGNOSIS — F4322 Adjustment disorder with anxiety: Secondary | ICD-10-CM

## 2018-04-18 DIAGNOSIS — R2232 Localized swelling, mass and lump, left upper limb: Secondary | ICD-10-CM | POA: Diagnosis not present

## 2018-05-06 ENCOUNTER — Ambulatory Visit (INDEPENDENT_AMBULATORY_CARE_PROVIDER_SITE_OTHER): Payer: Medicare Other | Admitting: Psychology

## 2018-05-06 DIAGNOSIS — F4322 Adjustment disorder with anxiety: Secondary | ICD-10-CM | POA: Diagnosis not present

## 2018-06-03 ENCOUNTER — Ambulatory Visit (INDEPENDENT_AMBULATORY_CARE_PROVIDER_SITE_OTHER): Payer: Medicare Other | Admitting: Psychology

## 2018-06-03 DIAGNOSIS — F4322 Adjustment disorder with anxiety: Secondary | ICD-10-CM

## 2018-06-17 DIAGNOSIS — L720 Epidermal cyst: Secondary | ICD-10-CM | POA: Diagnosis not present

## 2018-06-17 DIAGNOSIS — L570 Actinic keratosis: Secondary | ICD-10-CM | POA: Diagnosis not present

## 2018-06-17 DIAGNOSIS — L812 Freckles: Secondary | ICD-10-CM | POA: Diagnosis not present

## 2018-06-17 DIAGNOSIS — D1801 Hemangioma of skin and subcutaneous tissue: Secondary | ICD-10-CM | POA: Diagnosis not present

## 2018-06-17 DIAGNOSIS — L821 Other seborrheic keratosis: Secondary | ICD-10-CM | POA: Diagnosis not present

## 2018-06-17 DIAGNOSIS — Z85820 Personal history of malignant melanoma of skin: Secondary | ICD-10-CM | POA: Diagnosis not present

## 2018-07-01 ENCOUNTER — Ambulatory Visit (INDEPENDENT_AMBULATORY_CARE_PROVIDER_SITE_OTHER): Payer: Medicare Other | Admitting: Psychology

## 2018-07-01 DIAGNOSIS — F4322 Adjustment disorder with anxiety: Secondary | ICD-10-CM | POA: Diagnosis not present

## 2018-07-23 ENCOUNTER — Ambulatory Visit (INDEPENDENT_AMBULATORY_CARE_PROVIDER_SITE_OTHER): Payer: Medicare Other | Admitting: Psychology

## 2018-07-23 DIAGNOSIS — F4322 Adjustment disorder with anxiety: Secondary | ICD-10-CM | POA: Diagnosis not present

## 2018-08-01 ENCOUNTER — Ambulatory Visit: Payer: Medicare Other | Admitting: Obstetrics and Gynecology

## 2018-08-14 ENCOUNTER — Ambulatory Visit (INDEPENDENT_AMBULATORY_CARE_PROVIDER_SITE_OTHER): Payer: Medicare Other | Admitting: Psychology

## 2018-08-14 DIAGNOSIS — F4322 Adjustment disorder with anxiety: Secondary | ICD-10-CM

## 2018-08-21 DIAGNOSIS — Z1231 Encounter for screening mammogram for malignant neoplasm of breast: Secondary | ICD-10-CM | POA: Diagnosis not present

## 2018-09-02 ENCOUNTER — Ambulatory Visit (INDEPENDENT_AMBULATORY_CARE_PROVIDER_SITE_OTHER): Payer: Medicare Other | Admitting: Psychology

## 2018-09-02 DIAGNOSIS — F4322 Adjustment disorder with anxiety: Secondary | ICD-10-CM | POA: Diagnosis not present

## 2018-09-09 ENCOUNTER — Encounter: Payer: Self-pay | Admitting: Obstetrics and Gynecology

## 2018-09-25 ENCOUNTER — Ambulatory Visit (INDEPENDENT_AMBULATORY_CARE_PROVIDER_SITE_OTHER): Payer: Medicare Other | Admitting: Psychology

## 2018-09-25 DIAGNOSIS — F4322 Adjustment disorder with anxiety: Secondary | ICD-10-CM | POA: Diagnosis not present

## 2018-10-20 ENCOUNTER — Ambulatory Visit (INDEPENDENT_AMBULATORY_CARE_PROVIDER_SITE_OTHER): Payer: Medicare Other | Admitting: Psychology

## 2018-10-20 DIAGNOSIS — F4322 Adjustment disorder with anxiety: Secondary | ICD-10-CM

## 2018-10-22 IMAGING — DX DG CHEST 2V
2 series · 2 of 2 positions shown · non-contrast
Comparison: 11/21/2015

CLINICAL DATA: Fever, shortness of breath, productive cough, chest
pain

EXAM:
CHEST  2 VIEW

[chest pa]
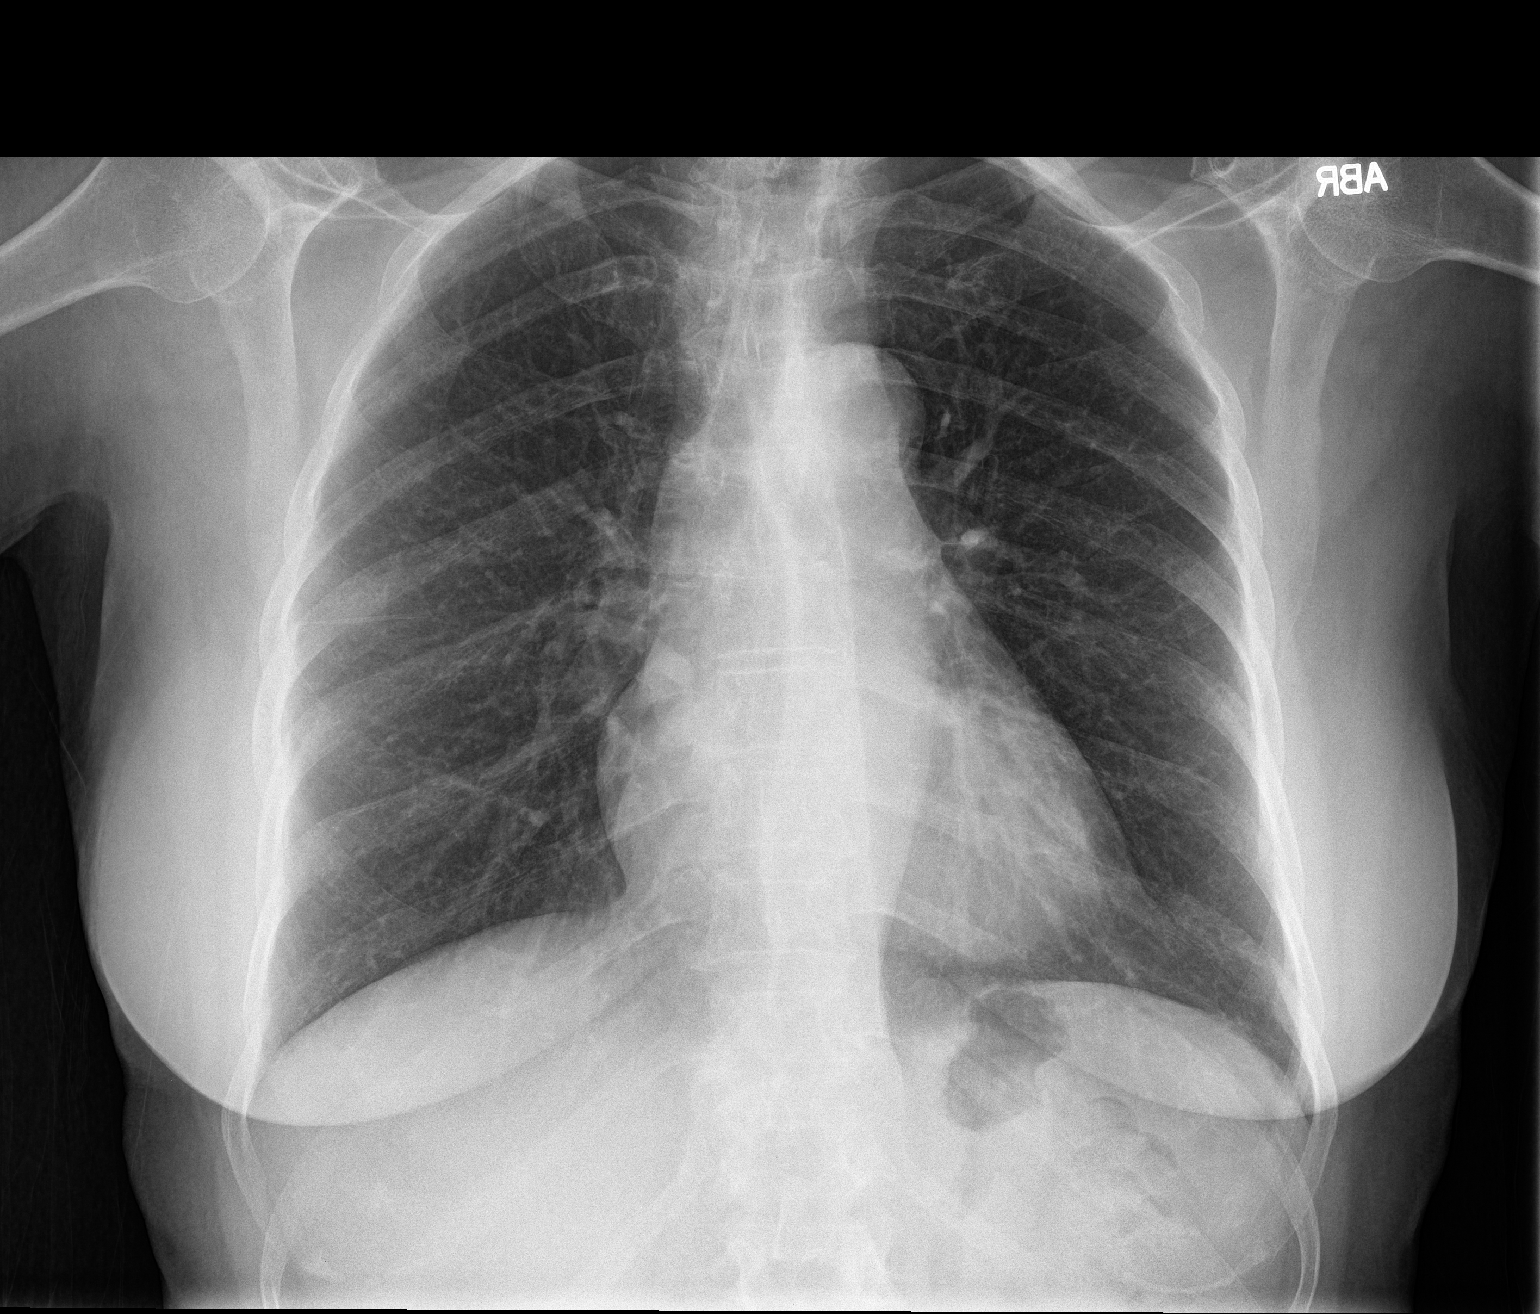

[chest lat]
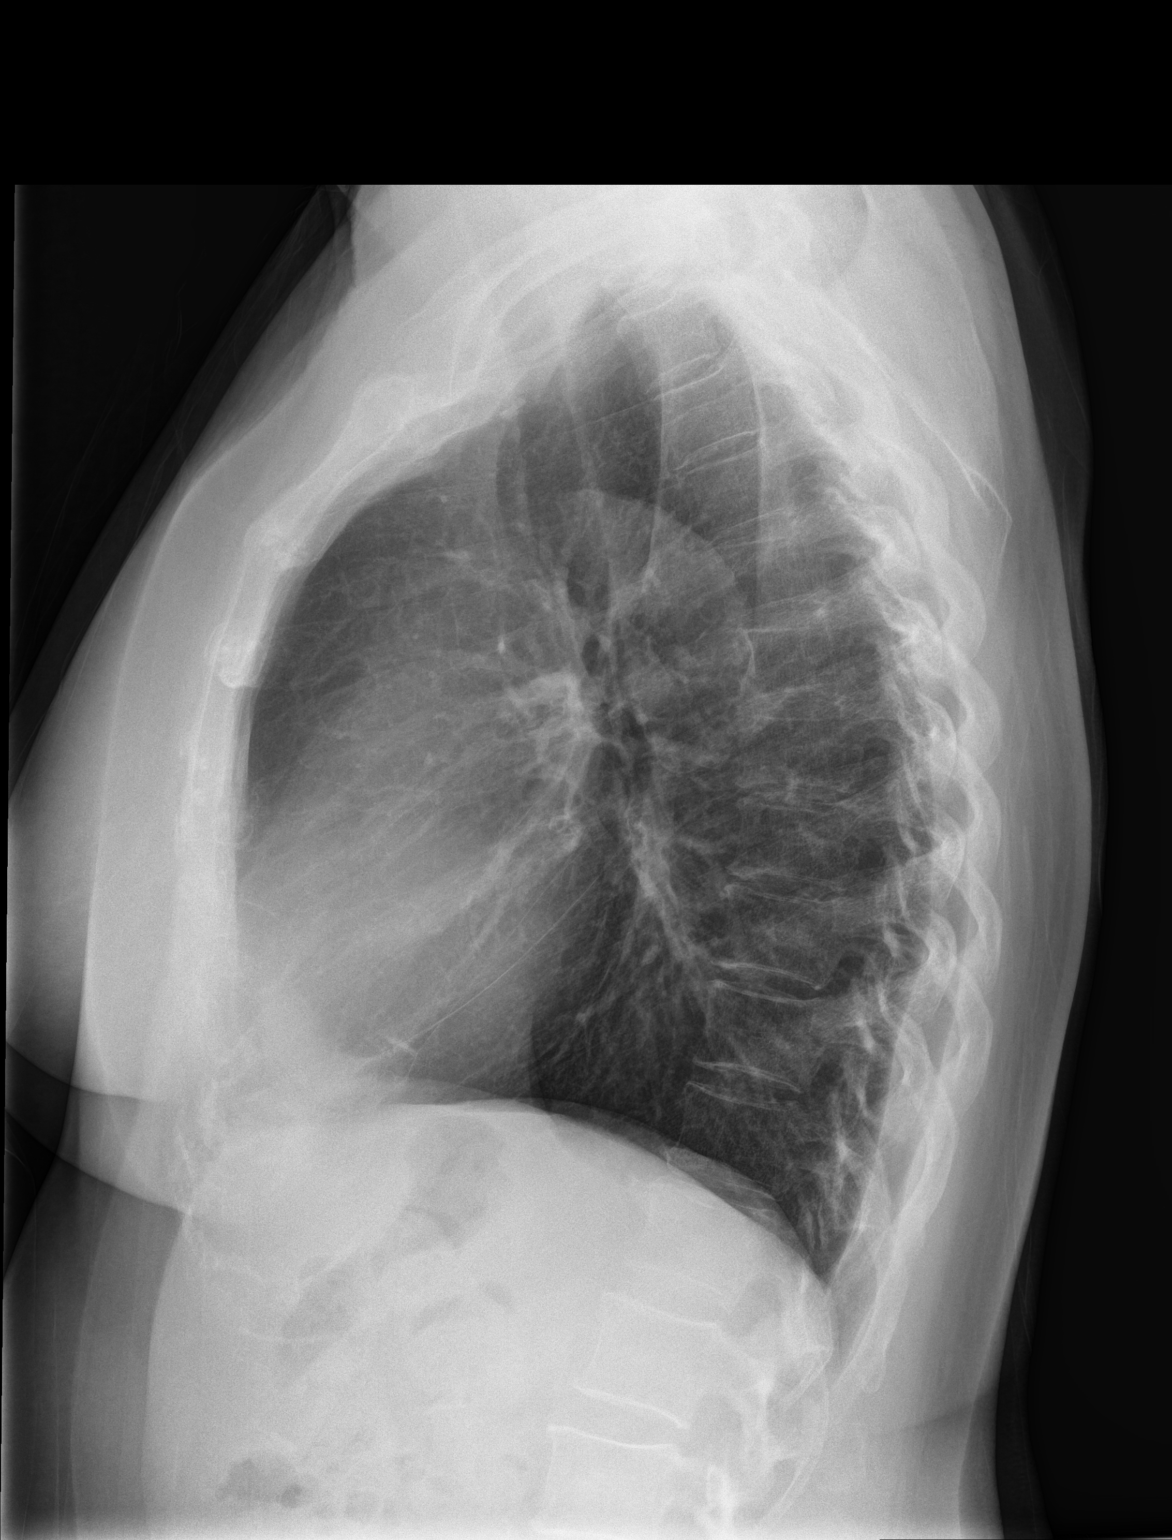

[2 of 2 positions shown; findings below may reference images not displayed]

FINDINGS: The heart size and mediastinal contours are within normal limits.
Both lungs are clear. The visualized skeletal structures are
unremarkable.
IMPRESSION: No active cardiopulmonary disease.

## 2018-11-05 ENCOUNTER — Ambulatory Visit (INDEPENDENT_AMBULATORY_CARE_PROVIDER_SITE_OTHER): Payer: Medicare Other | Admitting: Psychology

## 2018-11-05 DIAGNOSIS — F4322 Adjustment disorder with anxiety: Secondary | ICD-10-CM | POA: Diagnosis not present

## 2018-11-28 ENCOUNTER — Other Ambulatory Visit: Payer: Self-pay

## 2018-12-01 NOTE — Progress Notes (Signed)
68 y.o. G2P2 Widowed Caucasian female here for annual exam.    Denies vaginal bleeding.  No bladder or bowel issues.  Her international trips have been cancelled. Helping to care for her grandchildren.   PCP:  Sharilyn Sites, MD   Patient's last menstrual period was 04/02/2004.           Sexually active: No.  The current method of family planning is post menopausal status.    Exercising: Yes.    walks 40 minutes daily Smoker:  no  Health Maintenance: Pap: 08-01-17 Neg, 07-20-15 Neg  History of abnormal Pap:  Yes, years ago per patient MMG: 08-21-18 3D Neg/density B/BiRads1 Colonoscopy: 08-08-15 normal;next 07/2020 due to family hx BMD: 12-18-13  Result :normal with PCP TDaP:  2017 Gardasil:   n/a HIV: Donates blood Hep C:12/2017 Neg Screening Labs:  PCP. Flu vaccine:  Recommended.    reports that she has never smoked. She has never used smokeless tobacco. She reports that she does not drink alcohol or use drugs.  Past Medical History:  Diagnosis Date  . Endometrial hyperplasia without atypia, complex 04/24/2004   on hysteroscopic resection of polyps  . Hypercholesterolemia   . Keratosis   . Malignant melanoma of left forearm ([place])   . Shingles 11/2015  . Spondylosis of cervical joint     Past Surgical History:  Procedure Laterality Date  . BREAST SURGERY  Dec 2000   excision papilloma  . DILATION AND CURETTAGE OF UTERUS    . HYSTEROSCOPY WITH RESECTOSCOPE  04/24/2004  . MINOR BREAST BIOPSY Right 1994    Current Outpatient Medications  Medication Sig Dispense Refill  . Ascorbic Acid (VITAMIN C) 1000 MG tablet Take 1,000 mg by mouth daily.    . ASPIRIN LOW DOSE 81 MG EC tablet Take 81 mg by mouth daily.    . Calcium-Vitamin D (CALTRATE 600 PLUS-VIT D PO) Take 1 tablet by mouth daily.    . cetirizine (ZYRTEC) 10 MG tablet Take 10 mg by mouth daily.    . cholecalciferol (VITAMIN D) 1000 UNITS tablet Take 1,000 Units by mouth daily.    . fish oil-omega-3 fatty acids 1000 MG  capsule Take 1 g by mouth daily.    . MULTIPLE VITAMINS PO Take 1 tablet by mouth daily.     No current facility-administered medications for this visit.     Family History  Problem Relation Age of Onset  . COPD Mother   . Angina Mother   . Stroke Father   . Heart attack Father   . Colon cancer Sister 62       colon ca  . Cancer Sister   . Cancer Brother        prostate/bladder/kidney ca  . Colon cancer Other        83     Review of Systems  All other systems reviewed and are negative.   Exam:   BP 140/88   Pulse 66   Temp (!) 97.3 F (36.3 C) (Temporal)   Resp 16   Ht 5\' 3"  (1.6 m)   Wt 135 lb 12.8 oz (61.6 kg)   LMP 04/02/2004   BMI 24.06 kg/m     General appearance: alert, cooperative and appears stated age Head: normocephalic, without obvious abnormality, atraumatic Neck: no adenopathy, supple, symmetrical, trachea midline and thyroid normal to inspection and palpation Lungs: clear to auscultation bilaterally Breasts: normal appearance, no masses or tenderness, No nipple retraction or dimpling, No nipple discharge or bleeding, No axillary  adenopathy Heart: regular rate and rhythm Abdomen: soft, non-tender; no masses, no organomegaly Extremities: extremities normal, atraumatic, no cyanosis or edema Skin: skin color, texture, turgor normal. No rashes or lesions Lymph nodes: cervical, supraclavicular, and axillary nodes normal. Neurologic: grossly normal  Pelvic: External genitalia:  no lesions              No abnormal inguinal nodes palpated.              Urethra:  normal appearing urethra with no masses, tenderness or lesions              Bartholins and Skenes: normal                 Vagina: normal appearing vagina with normal color and discharge, no lesions              Cervix: no lesions              Pap taken: No. Bimanual Exam:  Uterus:  normal size, contour, position, consistency, mobility, non-tender              Adnexa: no mass, fullness, tenderness               Rectal exam: Yes.  .  Confirms.              Anus:  normal sphincter tone, no lesions  Chaperone was present for exam.  Assessment:   Well woman visit with normal exam. Hx complex endometrial hyperplasia without atypia.  FH colon cancer.  Hx melanoma.   Plan: Mammogram screening discussed. Self breast awareness reviewed. Pap and HR HPV as above. Guidelines for Calcium, Vitamin D, regular exercise program including cardiovascular and weight bearing exercise. Flu vaccine recommended.  Labs and Shingrix with PCP.  Follow up annually and prn.    After visit summary provided.

## 2018-12-02 ENCOUNTER — Encounter: Payer: Self-pay | Admitting: Obstetrics and Gynecology

## 2018-12-02 ENCOUNTER — Ambulatory Visit (INDEPENDENT_AMBULATORY_CARE_PROVIDER_SITE_OTHER): Payer: Medicare Other | Admitting: Obstetrics and Gynecology

## 2018-12-02 ENCOUNTER — Other Ambulatory Visit: Payer: Self-pay

## 2018-12-02 VITALS — BP 140/88 | HR 66 | Temp 97.3°F | Resp 16 | Ht 63.0 in | Wt 135.8 lb

## 2018-12-02 DIAGNOSIS — Z01419 Encounter for gynecological examination (general) (routine) without abnormal findings: Secondary | ICD-10-CM | POA: Diagnosis not present

## 2018-12-02 DIAGNOSIS — Z124 Encounter for screening for malignant neoplasm of cervix: Secondary | ICD-10-CM

## 2018-12-02 NOTE — Patient Instructions (Signed)

## 2018-12-03 ENCOUNTER — Ambulatory Visit (INDEPENDENT_AMBULATORY_CARE_PROVIDER_SITE_OTHER): Payer: Medicare Other | Admitting: Psychology

## 2018-12-03 DIAGNOSIS — F4322 Adjustment disorder with anxiety: Secondary | ICD-10-CM

## 2018-12-03 DIAGNOSIS — H04123 Dry eye syndrome of bilateral lacrimal glands: Secondary | ICD-10-CM | POA: Diagnosis not present

## 2018-12-03 DIAGNOSIS — H2512 Age-related nuclear cataract, left eye: Secondary | ICD-10-CM | POA: Diagnosis not present

## 2018-12-03 DIAGNOSIS — H10413 Chronic giant papillary conjunctivitis, bilateral: Secondary | ICD-10-CM | POA: Diagnosis not present

## 2018-12-03 DIAGNOSIS — H25811 Combined forms of age-related cataract, right eye: Secondary | ICD-10-CM | POA: Diagnosis not present

## 2018-12-09 DIAGNOSIS — E782 Mixed hyperlipidemia: Secondary | ICD-10-CM | POA: Diagnosis not present

## 2018-12-09 DIAGNOSIS — Z6823 Body mass index (BMI) 23.0-23.9, adult: Secondary | ICD-10-CM | POA: Diagnosis not present

## 2018-12-09 DIAGNOSIS — E7849 Other hyperlipidemia: Secondary | ICD-10-CM | POA: Diagnosis not present

## 2018-12-09 DIAGNOSIS — R079 Chest pain, unspecified: Secondary | ICD-10-CM | POA: Diagnosis not present

## 2018-12-09 DIAGNOSIS — C4362 Malignant melanoma of left upper limb, including shoulder: Secondary | ICD-10-CM | POA: Diagnosis not present

## 2018-12-09 DIAGNOSIS — Z0001 Encounter for general adult medical examination with abnormal findings: Secondary | ICD-10-CM | POA: Diagnosis not present

## 2018-12-09 DIAGNOSIS — Z23 Encounter for immunization: Secondary | ICD-10-CM | POA: Diagnosis not present

## 2018-12-09 DIAGNOSIS — Z1389 Encounter for screening for other disorder: Secondary | ICD-10-CM | POA: Diagnosis not present

## 2018-12-16 DIAGNOSIS — B078 Other viral warts: Secondary | ICD-10-CM | POA: Diagnosis not present

## 2018-12-16 DIAGNOSIS — Z85820 Personal history of malignant melanoma of skin: Secondary | ICD-10-CM | POA: Diagnosis not present

## 2018-12-16 DIAGNOSIS — L812 Freckles: Secondary | ICD-10-CM | POA: Diagnosis not present

## 2018-12-16 DIAGNOSIS — D485 Neoplasm of uncertain behavior of skin: Secondary | ICD-10-CM | POA: Diagnosis not present

## 2018-12-16 DIAGNOSIS — L821 Other seborrheic keratosis: Secondary | ICD-10-CM | POA: Diagnosis not present

## 2018-12-16 DIAGNOSIS — L720 Epidermal cyst: Secondary | ICD-10-CM | POA: Diagnosis not present

## 2018-12-16 DIAGNOSIS — B353 Tinea pedis: Secondary | ICD-10-CM | POA: Diagnosis not present

## 2018-12-16 DIAGNOSIS — D1801 Hemangioma of skin and subcutaneous tissue: Secondary | ICD-10-CM | POA: Diagnosis not present

## 2018-12-29 ENCOUNTER — Ambulatory Visit (INDEPENDENT_AMBULATORY_CARE_PROVIDER_SITE_OTHER): Payer: Medicare Other | Admitting: Psychology

## 2018-12-29 DIAGNOSIS — F4322 Adjustment disorder with anxiety: Secondary | ICD-10-CM | POA: Diagnosis not present

## 2019-01-28 ENCOUNTER — Ambulatory Visit (INDEPENDENT_AMBULATORY_CARE_PROVIDER_SITE_OTHER): Payer: Medicare Other | Admitting: Psychology

## 2019-01-28 DIAGNOSIS — F4322 Adjustment disorder with anxiety: Secondary | ICD-10-CM | POA: Diagnosis not present

## 2019-01-29 DIAGNOSIS — Z23 Encounter for immunization: Secondary | ICD-10-CM | POA: Diagnosis not present

## 2019-03-05 ENCOUNTER — Ambulatory Visit (INDEPENDENT_AMBULATORY_CARE_PROVIDER_SITE_OTHER): Payer: Medicare Other | Admitting: Psychology

## 2019-03-05 DIAGNOSIS — F4322 Adjustment disorder with anxiety: Secondary | ICD-10-CM | POA: Diagnosis not present

## 2019-04-01 DIAGNOSIS — Z23 Encounter for immunization: Secondary | ICD-10-CM | POA: Diagnosis not present

## 2019-04-16 ENCOUNTER — Ambulatory Visit (INDEPENDENT_AMBULATORY_CARE_PROVIDER_SITE_OTHER): Payer: Medicare Other | Admitting: Psychology

## 2019-04-16 DIAGNOSIS — F4322 Adjustment disorder with anxiety: Secondary | ICD-10-CM

## 2019-04-23 DIAGNOSIS — Z23 Encounter for immunization: Secondary | ICD-10-CM | POA: Diagnosis not present

## 2019-05-11 ENCOUNTER — Ambulatory Visit (INDEPENDENT_AMBULATORY_CARE_PROVIDER_SITE_OTHER): Payer: Medicare Other | Admitting: Psychology

## 2019-05-11 DIAGNOSIS — F4322 Adjustment disorder with anxiety: Secondary | ICD-10-CM | POA: Diagnosis not present

## 2019-05-25 DIAGNOSIS — Z23 Encounter for immunization: Secondary | ICD-10-CM | POA: Diagnosis not present

## 2019-06-01 ENCOUNTER — Ambulatory Visit (INDEPENDENT_AMBULATORY_CARE_PROVIDER_SITE_OTHER): Payer: Medicare Other | Admitting: Psychology

## 2019-06-01 DIAGNOSIS — F4322 Adjustment disorder with anxiety: Secondary | ICD-10-CM

## 2019-06-17 DIAGNOSIS — L7211 Pilar cyst: Secondary | ICD-10-CM | POA: Diagnosis not present

## 2019-06-17 DIAGNOSIS — L720 Epidermal cyst: Secondary | ICD-10-CM | POA: Diagnosis not present

## 2019-06-17 DIAGNOSIS — L812 Freckles: Secondary | ICD-10-CM | POA: Diagnosis not present

## 2019-06-17 DIAGNOSIS — D1801 Hemangioma of skin and subcutaneous tissue: Secondary | ICD-10-CM | POA: Diagnosis not present

## 2019-06-17 DIAGNOSIS — L738 Other specified follicular disorders: Secondary | ICD-10-CM | POA: Diagnosis not present

## 2019-06-17 DIAGNOSIS — L821 Other seborrheic keratosis: Secondary | ICD-10-CM | POA: Diagnosis not present

## 2019-06-17 DIAGNOSIS — Z85820 Personal history of malignant melanoma of skin: Secondary | ICD-10-CM | POA: Diagnosis not present

## 2019-06-17 DIAGNOSIS — B353 Tinea pedis: Secondary | ICD-10-CM | POA: Diagnosis not present

## 2019-06-22 ENCOUNTER — Ambulatory Visit (INDEPENDENT_AMBULATORY_CARE_PROVIDER_SITE_OTHER): Payer: Medicare Other | Admitting: Psychology

## 2019-06-22 DIAGNOSIS — F4322 Adjustment disorder with anxiety: Secondary | ICD-10-CM

## 2019-07-20 ENCOUNTER — Ambulatory Visit (INDEPENDENT_AMBULATORY_CARE_PROVIDER_SITE_OTHER): Payer: Medicare Other | Admitting: Psychology

## 2019-07-20 DIAGNOSIS — F4322 Adjustment disorder with anxiety: Secondary | ICD-10-CM | POA: Diagnosis not present

## 2019-08-18 ENCOUNTER — Ambulatory Visit (INDEPENDENT_AMBULATORY_CARE_PROVIDER_SITE_OTHER): Payer: Medicare Other | Admitting: Psychology

## 2019-08-18 DIAGNOSIS — F4322 Adjustment disorder with anxiety: Secondary | ICD-10-CM

## 2019-08-25 DIAGNOSIS — Z1231 Encounter for screening mammogram for malignant neoplasm of breast: Secondary | ICD-10-CM | POA: Diagnosis not present

## 2019-08-25 DIAGNOSIS — Z780 Asymptomatic menopausal state: Secondary | ICD-10-CM | POA: Diagnosis not present

## 2019-09-10 ENCOUNTER — Telehealth: Payer: Self-pay | Admitting: Obstetrics and Gynecology

## 2019-09-10 NOTE — Telephone Encounter (Signed)
Please contact patient with results of BMD which are normal!  Continue calcium, vitamin D, and weight bearing exercise.

## 2019-09-11 NOTE — Telephone Encounter (Signed)
Tried calling patient with results, no answer. Per DPR okay to leave detailed message at [phone]. Detailed message left for patient with normal results and recommendations. Okay to close encounter.

## 2019-09-11 NOTE — Telephone Encounter (Signed)
To Marisa Sprinkles, CMA to contact patient.

## 2019-09-14 ENCOUNTER — Ambulatory Visit (INDEPENDENT_AMBULATORY_CARE_PROVIDER_SITE_OTHER): Payer: Medicare Other | Admitting: Psychology

## 2019-09-14 DIAGNOSIS — F4322 Adjustment disorder with anxiety: Secondary | ICD-10-CM

## 2019-10-08 ENCOUNTER — Other Ambulatory Visit: Payer: Self-pay | Admitting: Family Medicine

## 2019-10-08 ENCOUNTER — Other Ambulatory Visit (HOSPITAL_COMMUNITY): Payer: Self-pay | Admitting: Family Medicine

## 2019-10-08 DIAGNOSIS — R519 Headache, unspecified: Secondary | ICD-10-CM

## 2019-10-08 DIAGNOSIS — I671 Cerebral aneurysm, nonruptured: Secondary | ICD-10-CM

## 2019-10-12 ENCOUNTER — Ambulatory Visit: Payer: Federal, State, Local not specified - PPO | Admitting: Psychology

## 2019-10-29 ENCOUNTER — Ambulatory Visit (INDEPENDENT_AMBULATORY_CARE_PROVIDER_SITE_OTHER): Payer: Medicare Other | Admitting: Psychology

## 2019-10-29 DIAGNOSIS — F4322 Adjustment disorder with anxiety: Secondary | ICD-10-CM

## 2019-11-02 ENCOUNTER — Other Ambulatory Visit: Payer: Self-pay

## 2019-11-02 ENCOUNTER — Ambulatory Visit (HOSPITAL_COMMUNITY)
Admission: RE | Admit: 2019-11-02 | Discharge: 2019-11-02 | Disposition: A | Discharge status: Home / Self Care | Payer: Medicare Other | Source: Ambulatory Visit | Attending: Family Medicine | Admitting: Family Medicine

## 2019-11-02 DIAGNOSIS — R519 Headache, unspecified: Secondary | ICD-10-CM | POA: Insufficient documentation

## 2019-11-02 DIAGNOSIS — I671 Cerebral aneurysm, nonruptured: Secondary | ICD-10-CM | POA: Diagnosis not present

## 2019-11-02 MED ORDER — GADOBUTROL 1 MMOL/ML IV SOLN
7.0000 mL | Freq: Once | INTRAVENOUS | Status: AC | PRN
  Administered 2019-11-02: 7 mL via INTRAVENOUS

## 2019-12-01 ENCOUNTER — Ambulatory Visit (INDEPENDENT_AMBULATORY_CARE_PROVIDER_SITE_OTHER): Payer: Medicare Other | Admitting: Psychology

## 2019-12-01 DIAGNOSIS — F4322 Adjustment disorder with anxiety: Secondary | ICD-10-CM | POA: Diagnosis not present

## 2019-12-02 NOTE — Progress Notes (Signed)
69 y.o. G2P2 Widowed Caucasian female here for annual exam.    Had pneumonia vaccine 09-30-19 and had redness in same arm area--under arm.  No vaginal bleeding or spotting.  Occasional urge to void.  Wears a pad and has occasional urinary incontinence.  Bowel function is ok.   PCP:  Sharilyn Sites, MD   Patient's last menstrual period was 04/02/2004.           Sexually active: No.  The current method of family planning is post menopausal status.    Exercising: Yes.    walks 2 miles daily Smoker:  no  Health Maintenance: Pap: 08-01-17 Neg, 07-20-15 Neg , 06-25-12 Neg:Neg HR HPV History of abnormal Pap:Yes, years ago per patient MMG: 5-5-2: BI-RADS1, cat B density Colonoscopy: 08-08-15 normal;next 07/2020 due to family hx BMD: 08-25-19  Result :Normal [facility]. TDaP: 2017 Gardasil:   no RDE:YCXKGYJ blood Hep C:Neg 12/2017 Screening Labs: PCP   reports that she has never smoked. She has never used smokeless tobacco. She reports that she does not drink alcohol and does not use drugs.  Past Medical History:  Diagnosis Date  . Endometrial hyperplasia without atypia, complex 04/24/2004   on hysteroscopic resection of polyps  . Hypercholesterolemia   . Keratosis   . Malignant melanoma of left forearm ([place])   . Shingles 11/2015  . Spondylosis of cervical joint     Past Surgical History:  Procedure Laterality Date  . BREAST SURGERY  Dec 2000   excision papilloma  . DILATION AND CURETTAGE OF UTERUS    . HYSTEROSCOPY WITH RESECTOSCOPE  04/24/2004  . MINOR BREAST BIOPSY Right 1994    Current Outpatient Medications  Medication Sig Dispense Refill  . Ascorbic Acid (VITAMIN C) 1000 MG tablet Take 1,000 mg by mouth daily.    . ASPIRIN LOW DOSE 81 MG EC tablet Take 81 mg by mouth daily.    . Calcium-Vitamin D (CALTRATE 600 PLUS-VIT D PO) Take 1 tablet by mouth daily.    . cetirizine (ZYRTEC) 10 MG tablet Take 10 mg by mouth daily.    . cholecalciferol (VITAMIN D) 1000 UNITS tablet Take 1,000  Units by mouth daily.    . fish oil-omega-3 fatty acids 1000 MG capsule Take 1 g by mouth daily.    . MULTIPLE VITAMINS PO Take 1 tablet by mouth daily.    . rosuvastatin (CRESTOR) 5 MG tablet Take 5 mg by mouth daily.     No current facility-administered medications for this visit.    Family History  Problem Relation Age of Onset  . COPD Mother   . Angina Mother   . Stroke Father   . Heart attack Father   . Colon cancer Sister 44       colon ca  . Cancer Sister   . Cancer Brother        prostate/bladder/kidney ca  . Colon cancer Other        34     Review of Systems  All other systems reviewed and are negative.   Exam:   BP 132/76   Pulse 64   Resp 14   Ht 5\' 3"  (1.6 m)   Wt 137 lb 6.4 oz (62.3 kg)   LMP 04/02/2004   BMI 24.34 kg/m     General appearance: alert, cooperative and appears stated age Head: normocephalic, without obvious abnormality, atraumatic Neck: no adenopathy, supple, symmetrical, trachea midline and thyroid normal to inspection and palpation Lungs: clear to auscultation bilaterally Breasts: normal appearance,  no masses or tenderness, No nipple retraction or dimpling, No nipple discharge or bleeding, No axillary adenopathy Heart: regular rate and rhythm Abdomen: soft, non-tender; no masses, no organomegaly Extremities: extremities normal, atraumatic, no cyanosis or edema Skin: skin color, texture, turgor normal. No rashes or lesions Lymph nodes: cervical, supraclavicular, and axillary nodes normal. Neurologic: grossly normal  Pelvic: External genitalia:  no lesions              No abnormal inguinal nodes palpated.              Urethra:  normal appearing urethra with no masses, tenderness or lesions              Bartholins and Skenes: normal                 Vagina: normal appearing vagina with normal color and discharge, no lesions              Cervix: no lesions              Pap taken: Yes.   Bimanual Exam:  Uterus:  normal size, contour,  position, consistency, mobility, non-tender              Adnexa: no mass, fullness, tenderness              Rectal exam: Yes.  .  Confirms.              Anus:  normal sphincter tone, no lesions  Chaperone was present for exam.  Assessment:   Well woman visit with normal exam. Hx complex endometrial hyperplasia without atypia.  FH colon cancer.  Hx melanoma.  Plan: Mammogram screening discussed. Self breast awareness reviewed. Pap and HR HPV as above. Guidelines for Calcium, Vitamin D, regular exercise program including cardiovascular and weight bearing exercise.   Follow up annually and prn.

## 2019-12-03 ENCOUNTER — Encounter: Payer: Self-pay | Admitting: Obstetrics and Gynecology

## 2019-12-03 ENCOUNTER — Ambulatory Visit (INDEPENDENT_AMBULATORY_CARE_PROVIDER_SITE_OTHER): Payer: Medicare Other | Admitting: Obstetrics and Gynecology

## 2019-12-03 ENCOUNTER — Other Ambulatory Visit: Payer: Self-pay

## 2019-12-03 ENCOUNTER — Other Ambulatory Visit (HOSPITAL_COMMUNITY)
Admission: RE | Admit: 2019-12-03 | Discharge: 2019-12-03 | Disposition: A | Discharge status: Home / Self Care | Payer: Medicare Other | Source: Ambulatory Visit | Attending: Obstetrics and Gynecology | Admitting: Obstetrics and Gynecology

## 2019-12-03 VITALS — BP 132/76 | HR 64 | Resp 14 | Ht 63.0 in | Wt 137.4 lb

## 2019-12-03 DIAGNOSIS — Q141 Congenital malformation of retina: Secondary | ICD-10-CM | POA: Diagnosis not present

## 2019-12-03 DIAGNOSIS — H0102B Squamous blepharitis left eye, upper and lower eyelids: Secondary | ICD-10-CM | POA: Diagnosis not present

## 2019-12-03 DIAGNOSIS — H2512 Age-related nuclear cataract, left eye: Secondary | ICD-10-CM | POA: Diagnosis not present

## 2019-12-03 DIAGNOSIS — Z124 Encounter for screening for malignant neoplasm of cervix: Secondary | ICD-10-CM

## 2019-12-03 DIAGNOSIS — Z01419 Encounter for gynecological examination (general) (routine) without abnormal findings: Secondary | ICD-10-CM | POA: Insufficient documentation

## 2019-12-03 DIAGNOSIS — H0102A Squamous blepharitis right eye, upper and lower eyelids: Secondary | ICD-10-CM | POA: Diagnosis not present

## 2019-12-03 DIAGNOSIS — H04123 Dry eye syndrome of bilateral lacrimal glands: Secondary | ICD-10-CM | POA: Diagnosis not present

## 2019-12-03 DIAGNOSIS — H25811 Combined forms of age-related cataract, right eye: Secondary | ICD-10-CM | POA: Diagnosis not present

## 2019-12-03 DIAGNOSIS — H10413 Chronic giant papillary conjunctivitis, bilateral: Secondary | ICD-10-CM | POA: Diagnosis not present

## 2019-12-03 NOTE — Patient Instructions (Signed)

## 2019-12-04 ENCOUNTER — Encounter: Payer: Self-pay | Admitting: Obstetrics and Gynecology

## 2019-12-08 LAB — CYTOLOGY - PAP: Diagnosis: NEGATIVE

## 2019-12-10 DIAGNOSIS — Z6823 Body mass index (BMI) 23.0-23.9, adult: Secondary | ICD-10-CM | POA: Diagnosis not present

## 2019-12-10 DIAGNOSIS — Z1389 Encounter for screening for other disorder: Secondary | ICD-10-CM | POA: Diagnosis not present

## 2019-12-10 DIAGNOSIS — R079 Chest pain, unspecified: Secondary | ICD-10-CM | POA: Diagnosis not present

## 2019-12-10 DIAGNOSIS — E559 Vitamin D deficiency, unspecified: Secondary | ICD-10-CM | POA: Diagnosis not present

## 2019-12-10 DIAGNOSIS — I671 Cerebral aneurysm, nonruptured: Secondary | ICD-10-CM | POA: Diagnosis not present

## 2019-12-10 DIAGNOSIS — Z0001 Encounter for general adult medical examination with abnormal findings: Secondary | ICD-10-CM | POA: Diagnosis not present

## 2019-12-10 DIAGNOSIS — E785 Hyperlipidemia, unspecified: Secondary | ICD-10-CM | POA: Diagnosis not present

## 2019-12-21 DIAGNOSIS — L821 Other seborrheic keratosis: Secondary | ICD-10-CM | POA: Diagnosis not present

## 2019-12-21 DIAGNOSIS — D2271 Melanocytic nevi of right lower limb, including hip: Secondary | ICD-10-CM | POA: Diagnosis not present

## 2019-12-21 DIAGNOSIS — L7211 Pilar cyst: Secondary | ICD-10-CM | POA: Diagnosis not present

## 2019-12-21 DIAGNOSIS — L814 Other melanin hyperpigmentation: Secondary | ICD-10-CM | POA: Diagnosis not present

## 2019-12-21 DIAGNOSIS — Z85820 Personal history of malignant melanoma of skin: Secondary | ICD-10-CM | POA: Diagnosis not present

## 2019-12-21 DIAGNOSIS — L720 Epidermal cyst: Secondary | ICD-10-CM | POA: Diagnosis not present

## 2019-12-23 DIAGNOSIS — Z23 Encounter for immunization: Secondary | ICD-10-CM | POA: Diagnosis not present

## 2019-12-25 ENCOUNTER — Ambulatory Visit (INDEPENDENT_AMBULATORY_CARE_PROVIDER_SITE_OTHER): Payer: Medicare Other | Admitting: Psychology

## 2019-12-25 DIAGNOSIS — F4322 Adjustment disorder with anxiety: Secondary | ICD-10-CM

## 2020-01-28 ENCOUNTER — Ambulatory Visit (INDEPENDENT_AMBULATORY_CARE_PROVIDER_SITE_OTHER): Payer: Medicare Other | Admitting: Psychology

## 2020-01-28 DIAGNOSIS — F4322 Adjustment disorder with anxiety: Secondary | ICD-10-CM

## 2020-02-06 DIAGNOSIS — Z23 Encounter for immunization: Secondary | ICD-10-CM | POA: Diagnosis not present

## 2020-02-23 ENCOUNTER — Ambulatory Visit (INDEPENDENT_AMBULATORY_CARE_PROVIDER_SITE_OTHER): Payer: Medicare Other | Admitting: Psychology

## 2020-02-23 DIAGNOSIS — F4322 Adjustment disorder with anxiety: Secondary | ICD-10-CM | POA: Diagnosis not present

## 2020-03-22 ENCOUNTER — Ambulatory Visit (INDEPENDENT_AMBULATORY_CARE_PROVIDER_SITE_OTHER): Payer: Medicare Other | Admitting: Psychology

## 2020-03-22 DIAGNOSIS — F4322 Adjustment disorder with anxiety: Secondary | ICD-10-CM | POA: Diagnosis not present

## 2020-04-20 ENCOUNTER — Ambulatory Visit (INDEPENDENT_AMBULATORY_CARE_PROVIDER_SITE_OTHER): Payer: Medicare Other | Admitting: Psychology

## 2020-04-20 DIAGNOSIS — F4322 Adjustment disorder with anxiety: Secondary | ICD-10-CM | POA: Diagnosis not present

## 2020-05-17 ENCOUNTER — Ambulatory Visit (INDEPENDENT_AMBULATORY_CARE_PROVIDER_SITE_OTHER): Payer: Medicare Other | Admitting: Psychology

## 2020-05-17 DIAGNOSIS — F4322 Adjustment disorder with anxiety: Secondary | ICD-10-CM

## 2020-06-14 ENCOUNTER — Ambulatory Visit (INDEPENDENT_AMBULATORY_CARE_PROVIDER_SITE_OTHER): Payer: Medicare Other | Admitting: Psychology

## 2020-06-14 DIAGNOSIS — F4322 Adjustment disorder with anxiety: Secondary | ICD-10-CM | POA: Diagnosis not present

## 2020-06-21 DIAGNOSIS — L821 Other seborrheic keratosis: Secondary | ICD-10-CM | POA: Diagnosis not present

## 2020-06-21 DIAGNOSIS — Z85820 Personal history of malignant melanoma of skin: Secondary | ICD-10-CM | POA: Diagnosis not present

## 2020-06-21 DIAGNOSIS — L812 Freckles: Secondary | ICD-10-CM | POA: Diagnosis not present

## 2020-06-21 DIAGNOSIS — L720 Epidermal cyst: Secondary | ICD-10-CM | POA: Diagnosis not present

## 2020-06-21 DIAGNOSIS — L308 Other specified dermatitis: Secondary | ICD-10-CM | POA: Diagnosis not present

## 2020-07-12 ENCOUNTER — Ambulatory Visit (INDEPENDENT_AMBULATORY_CARE_PROVIDER_SITE_OTHER): Payer: Medicare Other | Admitting: Psychology

## 2020-07-12 DIAGNOSIS — F4322 Adjustment disorder with anxiety: Secondary | ICD-10-CM

## 2020-08-09 ENCOUNTER — Ambulatory Visit (INDEPENDENT_AMBULATORY_CARE_PROVIDER_SITE_OTHER): Payer: Medicare Other | Admitting: Psychology

## 2020-08-09 DIAGNOSIS — F4322 Adjustment disorder with anxiety: Secondary | ICD-10-CM

## 2020-08-18 DIAGNOSIS — Z23 Encounter for immunization: Secondary | ICD-10-CM | POA: Diagnosis not present

## 2020-09-15 ENCOUNTER — Ambulatory Visit (INDEPENDENT_AMBULATORY_CARE_PROVIDER_SITE_OTHER): Payer: Medicare Other | Admitting: Psychology

## 2020-09-15 DIAGNOSIS — F4322 Adjustment disorder with anxiety: Secondary | ICD-10-CM

## 2020-09-27 DIAGNOSIS — Z8616 Personal history of COVID-19: Secondary | ICD-10-CM

## 2020-09-27 HISTORY — DX: Personal history of COVID-19: Z86.16

## 2020-09-29 ENCOUNTER — Ambulatory Visit (INDEPENDENT_AMBULATORY_CARE_PROVIDER_SITE_OTHER): Payer: Medicare Other | Admitting: Psychology

## 2020-09-29 DIAGNOSIS — F4322 Adjustment disorder with anxiety: Secondary | ICD-10-CM | POA: Diagnosis not present

## 2020-10-19 DIAGNOSIS — Z1231 Encounter for screening mammogram for malignant neoplasm of breast: Secondary | ICD-10-CM | POA: Diagnosis not present

## 2020-11-02 ENCOUNTER — Ambulatory Visit (INDEPENDENT_AMBULATORY_CARE_PROVIDER_SITE_OTHER): Payer: Medicare Other | Admitting: Psychology

## 2020-11-02 DIAGNOSIS — F4321 Adjustment disorder with depressed mood: Secondary | ICD-10-CM

## 2020-12-06 DIAGNOSIS — H04123 Dry eye syndrome of bilateral lacrimal glands: Secondary | ICD-10-CM | POA: Diagnosis not present

## 2020-12-06 DIAGNOSIS — H25811 Combined forms of age-related cataract, right eye: Secondary | ICD-10-CM | POA: Diagnosis not present

## 2020-12-06 DIAGNOSIS — H0102B Squamous blepharitis left eye, upper and lower eyelids: Secondary | ICD-10-CM | POA: Diagnosis not present

## 2020-12-06 DIAGNOSIS — H0102A Squamous blepharitis right eye, upper and lower eyelids: Secondary | ICD-10-CM | POA: Diagnosis not present

## 2020-12-06 DIAGNOSIS — Q141 Congenital malformation of retina: Secondary | ICD-10-CM | POA: Diagnosis not present

## 2020-12-06 DIAGNOSIS — H10413 Chronic giant papillary conjunctivitis, bilateral: Secondary | ICD-10-CM | POA: Diagnosis not present

## 2020-12-06 DIAGNOSIS — H2512 Age-related nuclear cataract, left eye: Secondary | ICD-10-CM | POA: Diagnosis not present

## 2020-12-07 ENCOUNTER — Ambulatory Visit (INDEPENDENT_AMBULATORY_CARE_PROVIDER_SITE_OTHER): Payer: Medicare Other | Admitting: Psychology

## 2020-12-07 DIAGNOSIS — F4322 Adjustment disorder with anxiety: Secondary | ICD-10-CM | POA: Diagnosis not present

## 2020-12-08 ENCOUNTER — Ambulatory Visit (INDEPENDENT_AMBULATORY_CARE_PROVIDER_SITE_OTHER): Payer: Medicare Other | Admitting: Obstetrics and Gynecology

## 2020-12-08 ENCOUNTER — Encounter: Payer: Self-pay | Admitting: Obstetrics and Gynecology

## 2020-12-08 ENCOUNTER — Other Ambulatory Visit: Payer: Self-pay

## 2020-12-08 VITALS — BP 136/78 | HR 59 | Ht 62.75 in | Wt 136.0 lb

## 2020-12-08 DIAGNOSIS — Z01419 Encounter for gynecological examination (general) (routine) without abnormal findings: Secondary | ICD-10-CM

## 2020-12-08 NOTE — Patient Instructions (Signed)

## 2020-12-08 NOTE — Progress Notes (Signed)
70 y.o. G2P2 Widowed Caucasian female here for annual well woman exam.  Did a mission trip and tested positive for Covid upon return.  She had been vaccinated and booster x 2.  Brother just had a cardiac stent placed.  PCP: Sharilyn Sites, MD  Patient's last menstrual period was 04/02/2004.           Sexually active: No.  The current method of family planning is post menopausal status.    Exercising: Yes.     Walks 1 mile daily and goes to [employer] 2-3 days/week Smoker:  no  Health Maintenance: Pap:  12-03-19 Neg, 08-01-17 Neg, 07-20-15 Neg  History of abnormal Pap:  Yes, years ago per patient.   MMG: 10-19-20 Neg/Birads1 Colonoscopy: 08-08-15 normal;next 07/2020 due to family hx..Scheduled 12/2020. BMD:  08-25-19  Result :Normal TDaP:  2017 Gardasil:   no HIV: Donates blood Hep C: 12/2017 Neg Screening Labs:  PCP   reports that she has never smoked. She has never used smokeless tobacco. She reports that she does not drink alcohol and does not use drugs.  Past Medical History:  Diagnosis Date   Endometrial hyperplasia without atypia, complex 04/24/2004   on hysteroscopic resection of polyps   History of COVID-19 09/27/2020   Hypercholesterolemia    Keratosis    Malignant melanoma of left forearm ([place])    Shingles 11/2015   Spondylosis of cervical joint     Past Surgical History:  Procedure Laterality Date   BREAST SURGERY  Dec 2000   excision papilloma   DILATION AND CURETTAGE OF UTERUS     HYSTEROSCOPY WITH RESECTOSCOPE  04/24/2004   MINOR BREAST BIOPSY Right 1994    Current Outpatient Medications  Medication Sig Dispense Refill   ASPIRIN LOW DOSE 81 MG EC tablet Take 81 mg by mouth daily.     cetirizine (ZYRTEC) 10 MG tablet Take 10 mg by mouth daily. Takes 1/2 tablet daily     cholecalciferol (VITAMIN D) 1000 UNITS tablet Take 1,000 Units by mouth daily.     fish oil-omega-3 fatty acids 1000 MG capsule Take 2 g by mouth daily.     MULTIPLE VITAMINS PO Take 1 tablet by mouth  daily.     rosuvastatin (CRESTOR) 5 MG tablet Take 5 mg by mouth daily. Takes 1/2 tablet daily     No current facility-administered medications for this visit.    Family History  Problem Relation Age of Onset   COPD Mother    Angina Mother    Stroke Father    Heart attack Father    Colon cancer Sister 82       colon ca   Cancer Sister    Cancer Brother        prostate/bladder/kidney ca   Colon cancer Other        40     Review of Systems  All other systems reviewed and are negative.  Exam:   BP 136/78   Pulse (!) 59   Ht 5' 2.75" (1.594 m)   Wt 136 lb (61.7 kg)   LMP 04/02/2004   SpO2 99%   BMI 24.28 kg/m     General appearance: alert, cooperative and appears stated age Head: normocephalic, without obvious abnormality, atraumatic Neck: no adenopathy, supple, symmetrical, trachea midline and thyroid normal to inspection and palpation Lungs: clear to auscultation bilaterally Breasts: normal appearance, no masses or tenderness, No nipple retraction or dimpling, No nipple discharge or bleeding, No axillary adenopathy Heart: regular rate and rhythm  Abdomen: soft, non-tender; no masses, no organomegaly Extremities: extremities normal, atraumatic, no cyanosis or edema Skin: skin color, texture, turgor normal. No rashes or lesions Lymph nodes: cervical, supraclavicular, and axillary nodes normal. Neurologic: grossly normal  Pelvic: External genitalia:  no lesions              No abnormal inguinal nodes palpated.              Urethra:  normal appearing urethra with no masses, tenderness or lesions              Bartholins and Skenes: normal                 Vagina: normal appearing vagina with normal color and discharge, no lesions              Cervix: no lesions              Pap taken: no. Bimanual Exam:  Uterus:  normal size, contour, position, consistency, mobility, non-tender              Adnexa: no mass, fullness, tenderness              Rectal exam: yes..  Confirms.               Anus:  normal sphincter tone, no lesions  Chaperone was present for exam:  Estill Bamberg, CMA.  Assessment:   Well woman visit with gynecologic exam. Hx complex endometrial hyperplasia without atypia.  FH colon cancer.  Hx melanoma.  FH CVD.  Plan: Mammogram screening discussed. Self breast awareness reviewed. Pap and HR HPV as above. Guidelines for Calcium, Vitamin D, regular exercise program including cardiovascular and weight bearing exercise. Colonoscopy scheduled.  Follow up annually and prn.    After visit summary provided.

## 2020-12-12 DIAGNOSIS — R079 Chest pain, unspecified: Secondary | ICD-10-CM | POA: Diagnosis not present

## 2020-12-12 DIAGNOSIS — E785 Hyperlipidemia, unspecified: Secondary | ICD-10-CM | POA: Diagnosis not present

## 2020-12-12 DIAGNOSIS — E559 Vitamin D deficiency, unspecified: Secondary | ICD-10-CM | POA: Diagnosis not present

## 2020-12-12 DIAGNOSIS — Z0001 Encounter for general adult medical examination with abnormal findings: Secondary | ICD-10-CM | POA: Diagnosis not present

## 2020-12-12 DIAGNOSIS — Z1389 Encounter for screening for other disorder: Secondary | ICD-10-CM | POA: Diagnosis not present

## 2020-12-12 DIAGNOSIS — Z6823 Body mass index (BMI) 23.0-23.9, adult: Secondary | ICD-10-CM | POA: Diagnosis not present

## 2020-12-12 DIAGNOSIS — C4362 Malignant melanoma of left upper limb, including shoulder: Secondary | ICD-10-CM | POA: Diagnosis not present

## 2020-12-12 DIAGNOSIS — Z1331 Encounter for screening for depression: Secondary | ICD-10-CM | POA: Diagnosis not present

## 2020-12-12 DIAGNOSIS — I671 Cerebral aneurysm, nonruptured: Secondary | ICD-10-CM | POA: Diagnosis not present

## 2020-12-22 DIAGNOSIS — D1801 Hemangioma of skin and subcutaneous tissue: Secondary | ICD-10-CM | POA: Diagnosis not present

## 2020-12-22 DIAGNOSIS — L812 Freckles: Secondary | ICD-10-CM | POA: Diagnosis not present

## 2020-12-22 DIAGNOSIS — Z85820 Personal history of malignant melanoma of skin: Secondary | ICD-10-CM | POA: Diagnosis not present

## 2020-12-22 DIAGNOSIS — L720 Epidermal cyst: Secondary | ICD-10-CM | POA: Diagnosis not present

## 2020-12-22 DIAGNOSIS — L821 Other seborrheic keratosis: Secondary | ICD-10-CM | POA: Diagnosis not present

## 2020-12-22 DIAGNOSIS — L7211 Pilar cyst: Secondary | ICD-10-CM | POA: Diagnosis not present

## 2020-12-28 ENCOUNTER — Telehealth: Payer: Self-pay

## 2020-12-28 NOTE — Telephone Encounter (Signed)
NOTES ON FILE FROM [facility] [phone], SENT REFERRAL TO SCHEDULING

## 2021-01-04 ENCOUNTER — Ambulatory Visit (INDEPENDENT_AMBULATORY_CARE_PROVIDER_SITE_OTHER): Payer: Medicare Other | Admitting: Psychology

## 2021-01-04 DIAGNOSIS — F4322 Adjustment disorder with anxiety: Secondary | ICD-10-CM

## 2021-01-06 ENCOUNTER — Telehealth: Payer: Self-pay

## 2021-01-06 NOTE — Telephone Encounter (Signed)
NOTES SCANNED TO REFERRAL 

## 2021-01-16 DIAGNOSIS — Z86010 Personal history of colonic polyps: Secondary | ICD-10-CM | POA: Diagnosis not present

## 2021-01-16 DIAGNOSIS — K5730 Diverticulosis of large intestine without perforation or abscess without bleeding: Secondary | ICD-10-CM | POA: Diagnosis not present

## 2021-01-16 DIAGNOSIS — K649 Unspecified hemorrhoids: Secondary | ICD-10-CM | POA: Diagnosis not present

## 2021-01-23 DIAGNOSIS — Z23 Encounter for immunization: Secondary | ICD-10-CM | POA: Diagnosis not present

## 2021-02-01 ENCOUNTER — Ambulatory Visit (INDEPENDENT_AMBULATORY_CARE_PROVIDER_SITE_OTHER): Payer: Medicare Other | Admitting: Psychology

## 2021-02-01 DIAGNOSIS — F4322 Adjustment disorder with anxiety: Secondary | ICD-10-CM

## 2021-03-01 ENCOUNTER — Ambulatory Visit (INDEPENDENT_AMBULATORY_CARE_PROVIDER_SITE_OTHER): Payer: Medicare Other | Admitting: Psychology

## 2021-03-01 DIAGNOSIS — F4322 Adjustment disorder with anxiety: Secondary | ICD-10-CM

## 2021-03-16 DIAGNOSIS — Z23 Encounter for immunization: Secondary | ICD-10-CM | POA: Diagnosis not present

## 2021-04-05 ENCOUNTER — Ambulatory Visit (INDEPENDENT_AMBULATORY_CARE_PROVIDER_SITE_OTHER): Payer: Medicare Other | Admitting: Psychology

## 2021-04-05 DIAGNOSIS — F4322 Adjustment disorder with anxiety: Secondary | ICD-10-CM
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
## 2021-05-03 ENCOUNTER — Ambulatory Visit (INDEPENDENT_AMBULATORY_CARE_PROVIDER_SITE_OTHER): Payer: Medicare Other | Admitting: Psychology

## 2021-05-03 DIAGNOSIS — F4322 Adjustment disorder with anxiety: Secondary | ICD-10-CM

## 2021-05-03 NOTE — Progress Notes (Signed)
[place] Counselor/Therapist Progress Note  Patient ID: Virginia Brooks, MRN: [id number],    Date: 05/03/2021  Time Spent: 10:00am-11:00am  60 minutes   Treatment Type: Individual Therapy  Reported Symptoms: stress, worrying  Mental Status Exam: Appearance:  Casual and Neat     Behavior: Appropriate  Motor: Normal  Speech/Language:  Normal Rate  Affect: Appropriate  Mood: normal  Thought process: normal  Thought content:   WNL  Sensory/Perceptual disturbances:   WNL  Orientation: oriented to person, place, time/date, and situation  Attention: Good  Concentration: Good  Memory: WNL  Fund of knowledge:  Good  Insight:   Good  Judgment:  Good  Impulse Control: Good   Risk Assessment: Danger to Self:  No Self-injurious Behavior: No Danger to Others: No Duty to Warn:no Physical Aggression / Violence:No  Access to Firearms a concern: No  Gang Involvement:No   Subjective:  Pt present for individual face-to-face therapy via video.  Pt consented to telehealth video therapy due to COVID 19 pandemic.  Location of pt: home Location of therapist: home office. Pt talked about her grandson getting accepted into [employer].  He has applied to several other colleges as well.  He is applying out of state but pt hopes he stays more local.  She is going to miss him when he leaves for college.  Pt talked about her brother Juanda Crumble.  She takes him to physical therapy every Monday.  She is taking him to doctors' appointments.  Juanda Crumble' birthday is February 23rd and he wants to go to [facility].   Pt is planning to take him and will stay Thursday-Sunday.   Addressed how challenging caregiving for Juanda Crumble can be.   Pt talked about helping with her grandkids a lot.  She enjoys the time with them.   Pt has had to have some home repairs done.  She tends to procrastinate on home projects.  Addressed how pt can set small attainable goals that will not feel overwhelming to her.    Pt had to go to a funeral for extended family. Pt talked about her health.  She has pressure in her chest at times and will be seeing a cardiologist.  Addressed pt's health concerns.   Provided supportive counseling and encouraged self care.    Interventions: Cognitive Behavioral Therapy and Insight-Oriented  Diagnosis: F43.22  Plan: See pt's Treatment Plan for anxiety in Therapy Charts.  (Treatment Plan Target Date: 06/14/2021) Pt is progressing toward treatment goals.   Plan to continue to see pt monthly.    Rena Sweeden, LCSW

## 2021-05-31 ENCOUNTER — Ambulatory Visit (INDEPENDENT_AMBULATORY_CARE_PROVIDER_SITE_OTHER): Payer: Medicare Other | Admitting: Psychology

## 2021-05-31 DIAGNOSIS — F4322 Adjustment disorder with anxiety: Secondary | ICD-10-CM
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
## 2021-06-18 NOTE — Progress Notes (Signed)
? ?Primary Physician/Referring:  Sharilyn Sites, MD ? ?Patient ID: Virginia Brooks, female    DOB: November 25, 1950, 71 y.o.   MRN: [id number] ? ?Chief Complaint  ?Patient presents with  ? Chest Pain  ? New Patient (Initial Visit)  ?  Referred by Sharilyn Sites, MD  ? ?HPI:   ? ?Virginia Brooks  is a 71 y.o. Caucasian female patient with hyperlipidemia, family history of cerebral saccular aneurysm, vitamin D deficiency, referred to me for evaluation of chest pain by Dr. Eddie Candle. ? ?Chest pain is described as tightness in the middle of the chest, rarely she feels?  Radiation to the neck.  Each episode last anywhere from 3 to 4 minutes, almost occurring at rest and not with exertion activity.  No other associated symptoms.  No PND or orthopnea.  She continues to remain active.  She walks at least 1 mile a day.  Previously used to exercise regularly in the gym, but has discontinued this due to [place] pandemic. ? ?Past Medical History:  ?Diagnosis Date  ? Endometrial hyperplasia without atypia, complex 04/24/2004  ? on hysteroscopic resection of polyps  ? History of COVID-19 09/27/2020  ? Hypercholesterolemia   ? Keratosis   ? Malignant melanoma of left forearm ([place])   ? Shingles 11/2015  ? Spondylosis of cervical joint   ? ?Past Surgical History:  ?Procedure Laterality Date  ? BREAST SURGERY  Dec 2000  ? excision papilloma  ? DILATION AND CURETTAGE OF UTERUS    ? HYSTEROSCOPY WITH RESECTOSCOPE  04/24/2004  ? MINOR BREAST BIOPSY Right 1994  ? ?Family History  ?Problem Relation Age of Onset  ? COPD Mother   ? Angina Mother   ? Stroke Father   ? Heart attack Father   ? Colon cancer Sister 42  ?     colon ca  ? Cancer Sister   ? Cancer Brother   ?     prostate/bladder/kidney ca  ? Colon cancer Other   ?     27   ?  ?Social History  ? ?Tobacco Use  ? Smoking status: Never  ? Smokeless tobacco: Never  ?Substance Use Topics  ? Alcohol use: No  ?  Alcohol/week: 0.0 standard drinks  ? ?Marital Status: Widowed  ?ROS  ?Review of Systems   ?Cardiovascular:  Positive for chest pain. Negative for dyspnea on exertion and leg swelling.  ?Objective  ?Blood pressure 140/83, pulse (!) 57, temperature 98.3 ?F (36.8 ?C), temperature source Temporal, resp. rate 16, height _0  (1.575 m), weight 138 lb 9.6 oz (62.9 kg), last menstrual period 04/02/2004, SpO2 98 %. Body mass index is 25.35 kg/m?.  ?Vitals with BMI 06/19/2021 12/08/2020 12/03/2019  ?Height _1  5' 2.75" _2   ?Weight 138 lbs 10 oz 136 lbs 137 lbs 6 oz  ?BMI 25.34 24.28 24.35  ?Systolic 938 182 993  ?Diastolic 83 78 76  ?Pulse 57 59 64  ?  ?Physical Exam ?Neck:  ?   Vascular: No JVD.  ?Cardiovascular:  ?   Rate and Rhythm: Normal rate and regular rhythm.  ?   Pulses: Intact distal pulses.  ?   Heart sounds: Normal heart sounds. No murmur heard. ?  No gallop.  ?Pulmonary:  ?   Effort: Pulmonary effort is normal.  ?   Breath sounds: Normal breath sounds.  ?Abdominal:  ?   General: Bowel sounds are normal.  ?   Palpations: Abdomen is soft.  ?Musculoskeletal:  ?   Right lower  leg: No edema.  ?   Left lower leg: No edema.  ?  ? ?Medications and allergies  ?No Known Allergies  ? ?Medication list after today's encounter  ? ?Current Outpatient Medications:  ?  ASPIRIN LOW DOSE 81 MG EC tablet, Take 81 mg by mouth daily., Disp: , Rfl:  ?  cetirizine (ZYRTEC) 10 MG tablet, Take 10 mg by mouth daily. Takes 1/2 tablet daily, Disp: , Rfl:  ?  cholecalciferol (VITAMIN D) 1000 UNITS tablet, Take 1,000 Units by mouth daily., Disp: , Rfl:  ?  fish oil-omega-3 fatty acids 1000 MG capsule, Take 2 g by mouth daily., Disp: , Rfl:  ?  ketotifen (ZADITOR) 0.025 % ophthalmic solution, 1 drop 2 (two) times daily., Disp: , Rfl:  ?  MULTIPLE VITAMINS PO, Take 1 tablet by mouth daily., Disp: , Rfl:  ?  rosuvastatin (CRESTOR) 5 MG tablet, Take 5 mg by mouth daily., Disp: , Rfl:  ? ?Laboratory examination:  ? ?External labs:  ? ?Labs 12/12/2020: ? ?Hb 13.9/HCT 40.6, platelets 208, normal indicis. ? ?Serum glucose 89 mg, BUN 10,  creatinine 0.73, EGFR 87,, potassium 4.6.  LFTs normal. ? ?Total cholesterol 173, triglycerides 220, HDL 50, LDL 86. ? ?TSH normal at 1.010. ? ?Radiology:  ? ?NA ? ?Cardiac Studies:  ? ?Negative nuclear stress in 2004 and normal echo. ? ?EKG:  ? ?EKG 06/19/2021: Normal sinus rhythm at rate of 52 bpm, right atrial enlargement, normal axis.  Nonspecific T abnormality.   ? ?Assessment  ? ?  ICD-10-CM   ?1. Precordial pain  R07.2 EKG 12-Lead  ?  PCV ECHOCARDIOGRAM COMPLETE  ?  PCV CARDIAC STRESS TEST  ?  ?2. Mixed hyperlipidemia  E78.2 Lipid Panel With LDL/HDL Ratio  ?  ?3. Elevated BP without diagnosis of hypertension  R03.0   ?  ?  ?There are no discontinued medications.  ?No orders of the defined types were placed in this encounter. ? ?Orders Placed This Encounter  ?Procedures  ? Lipid Panel With LDL/HDL Ratio  ?  Standing Status:   Future  ?  Standing Expiration Date:   08/19/2021  ? PCV CARDIAC STRESS TEST  ?  Standing Status:   Future  ?  Standing Expiration Date:   08/19/2021  ? EKG 12-Lead  ? PCV ECHOCARDIOGRAM COMPLETE  ?  Standing Status:   Future  ?  Standing Expiration Date:   06/20/2022  ? ?Recommendations:  ? ?Virginia Brooks is a 71 y.o.  Caucasian female patient with hyperlipidemia, family history of cerebral saccular aneurysm, vitamin D deficiency, referred to me for evaluation of chest pain by Dr. Eddie Candle. ? ?Chest pain has been ongoing for quite some time, described as retrosternal pain, sometimes radiating to the neck but she is not sure, lasts only about 3 to 5 minutes.  Most episodes are occurring at rest than with exertion activity.  Symptoms may indicate Prinzmetal's angina pectoris.  I will schedule her for a echocardiogram to evaluate chest pain along with a routine treadmill exercise stress test given physical exam being normal and normal EKG. ? ?Discussed with her regarding mixed hyperlipidemia.  Patient's diet is fairly poor, she eats fried food at least 3 times a week or more.  We have  discussed regarding diet modification, will increase Crestor from 2.5 mg to 5 mg daily.  We will recheck lipids prior to her next office visit with me in 6 weeks or so. ? ?Her blood pressure is elevated today, will continue  to trend this.  There is no diagnosis of hypertension in the past.  Patient has noticed elevated blood pressure at least a couple of times including her dentist visit. ? ? ? ?Adrian Prows, MD, [facility] ?06/19/2021, 12:29 PM ?Office: [phone] ?

## 2021-06-19 ENCOUNTER — Ambulatory Visit: Payer: Medicare Other | Admitting: Cardiology

## 2021-06-19 ENCOUNTER — Encounter: Payer: Self-pay | Admitting: Cardiology

## 2021-06-19 ENCOUNTER — Other Ambulatory Visit: Payer: Self-pay

## 2021-06-19 VITALS — BP 140/83 | HR 57 | Temp 98.3°F | Resp 16 | Ht 62.0 in | Wt 138.6 lb

## 2021-06-19 DIAGNOSIS — R0789 Other chest pain: Secondary | ICD-10-CM

## 2021-06-19 DIAGNOSIS — R030 Elevated blood-pressure reading, without diagnosis of hypertension: Secondary | ICD-10-CM | POA: Diagnosis not present

## 2021-06-19 DIAGNOSIS — E7800 Pure hypercholesterolemia, unspecified: Secondary | ICD-10-CM

## 2021-06-19 DIAGNOSIS — R072 Precordial pain: Secondary | ICD-10-CM

## 2021-06-19 DIAGNOSIS — E782 Mixed hyperlipidemia: Secondary | ICD-10-CM

## 2021-06-23 ENCOUNTER — Ambulatory Visit: Payer: Medicare Other

## 2021-06-23 ENCOUNTER — Other Ambulatory Visit: Payer: Self-pay

## 2021-06-23 DIAGNOSIS — R072 Precordial pain: Secondary | ICD-10-CM

## 2021-06-25 NOTE — Progress Notes (Signed)
Normal echo mild leak in mitral valve and aortic valve and nothing to worry about and will discuss on OV

## 2021-06-27 NOTE — Progress Notes (Signed)
Called and spoke with patient regarding her echocardiogram results.

## 2021-06-28 ENCOUNTER — Ambulatory Visit (INDEPENDENT_AMBULATORY_CARE_PROVIDER_SITE_OTHER): Payer: Medicare Other | Admitting: Psychology

## 2021-06-28 DIAGNOSIS — F4322 Adjustment disorder with anxiety: Secondary | ICD-10-CM
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
## 2021-07-06 DIAGNOSIS — L7211 Pilar cyst: Secondary | ICD-10-CM | POA: Diagnosis not present

## 2021-07-06 DIAGNOSIS — B351 Tinea unguium: Secondary | ICD-10-CM | POA: Diagnosis not present

## 2021-07-06 DIAGNOSIS — D2272 Melanocytic nevi of left lower limb, including hip: Secondary | ICD-10-CM | POA: Diagnosis not present

## 2021-07-06 DIAGNOSIS — B353 Tinea pedis: Secondary | ICD-10-CM | POA: Diagnosis not present

## 2021-07-06 DIAGNOSIS — L812 Freckles: Secondary | ICD-10-CM | POA: Diagnosis not present

## 2021-07-06 DIAGNOSIS — L821 Other seborrheic keratosis: Secondary | ICD-10-CM | POA: Diagnosis not present

## 2021-07-06 DIAGNOSIS — D1801 Hemangioma of skin and subcutaneous tissue: Secondary | ICD-10-CM | POA: Diagnosis not present

## 2021-07-06 DIAGNOSIS — Z85820 Personal history of malignant melanoma of skin: Secondary | ICD-10-CM | POA: Diagnosis not present

## 2021-07-06 DIAGNOSIS — D2271 Melanocytic nevi of right lower limb, including hip: Secondary | ICD-10-CM | POA: Diagnosis not present

## 2021-07-14 ENCOUNTER — Ambulatory Visit: Payer: Medicare Other

## 2021-07-14 ENCOUNTER — Other Ambulatory Visit: Payer: Self-pay

## 2021-07-14 DIAGNOSIS — R072 Precordial pain: Secondary | ICD-10-CM

## 2021-07-14 MED ORDER — ROSUVASTATIN CALCIUM 5 MG PO TABS: 5.0000 mg | ORAL_TABLET | Freq: Every day | ORAL | 3 refills | Status: AC

## 2021-07-16 LAB — PCV CARDIAC STRESS TEST
Angina Index: 0
ST Depression (mm): 0 mm

## 2021-07-25 ENCOUNTER — Other Ambulatory Visit: Payer: Self-pay | Admitting: Cardiology

## 2021-07-25 DIAGNOSIS — E782 Mixed hyperlipidemia: Secondary | ICD-10-CM | POA: Diagnosis not present

## 2021-07-26 ENCOUNTER — Ambulatory Visit (INDEPENDENT_AMBULATORY_CARE_PROVIDER_SITE_OTHER): Payer: Medicare Other | Admitting: Psychology

## 2021-07-26 DIAGNOSIS — F4322 Adjustment disorder with anxiety: Secondary | ICD-10-CM | POA: Diagnosis not present

## 2021-07-26 LAB — LIPID PANEL W/O CHOL/HDL RATIO
Cholesterol, Total: 153 mg/dL (ref 100–199)
HDL: 58 mg/dL (ref >39)
LDL Chol Calc (NIH): 68 mg/dL (ref 0–99)
Triglycerides: 158 mg/dL — ABNORMAL HIGH (ref 0–149)
VLDL Cholesterol Cal: 27 mg/dL (ref 5–40)

## 2021-07-26 LAB — SPECIMEN STATUS REPORT

## 2021-07-26 NOTE — Progress Notes (Signed)
[facility] Counselor/Therapist Progress Note ? ?Patient ID: Virginia Brooks, MRN: [id number],   ? ?Date: 07/26/2021 ? ?Time Spent: 10:00am-10:55am   55 minutes  ? ?Treatment Type: Individual Therapy ? ?Reported Symptoms: stress ? ?Mental Status Exam: ?Appearance:  Casual and Neat     ?Behavior: Appropriate  ?Motor: Normal  ?Speech/Language:  Normal Rate  ?Affect: Appropriate  ?Mood: normal  ?Thought process: normal  ?Thought content:   WNL  ?Sensory/Perceptual disturbances:   WNL  ?Orientation: oriented to person, place, time/date, and situation  ?Attention: Good  ?Concentration: Good  ?Memory: WNL  ?Fund of knowledge:  Good  ?Insight:   Good  ?Judgment:  Good  ?Impulse Control: Good  ? ?Risk Assessment: ?Danger to Self:  No ?Self-injurious Behavior: No ?Danger to Others: No ?Duty to Warn:no ?Physical Aggression / Violence:No  ?Access to Firearms a concern: No  ?Gang Involvement:No  ? ?Subjective:  ?Pt present for face-to-face individual therapy via video Webex.  Pt consents to telehealth video session due to COVID 19 pandemic. ?Location of pt: home ?Location of therapist: home office.   ?Pt talked about her 71 years old neighbor passing away recently.  Pt was friends with him.  They have been neighbors for 49 years.   He helped her out a lot because he felt compelled to help a widow.  He often told pt she is pretty.  Pt attended the funeral.  Helped pt process her feelings and grief. ?Pt is busy planning a class reunion that will be in May.  Only 12 people will be attending.   ?Pt talked about her health.  She sees her cardiologist this week.  She has had some tests done and will go over results with the doctor this week.  Pt has been told to eat less fried foods.   ?Pt has filed her taxes but has still not heard back from IRS about her audit from last year.  Addressed how frustrating this is for pt.   ?Pt has been transporting [place] places.  He has had some funerals to attend.  Juanda Crumble has not been  good with his hygiene so pt has had to talk with him about it.  Juanda Crumble does not always listen to pt which is frustrating for pt.  Addressed pt's frustrations.  ?Pt talked about her grandson's.  Her oldest grandson graduates from high school this year and will go to [employer] in the fall.   ?Provided supportive therapy. ? ? ?Interventions: Cognitive Behavioral Therapy and Insight-Oriented ? ?Diagnosis: F43.22 ? ?Plan of Care: Recommend ongoing therapy.   Plan to meet monthly.  Pt is progressing toward treatment goals.   ? ?Treatment Plan (Treatment Plan Target Date:  06/29/2022) ? ?Client Abilities/Strengths  ?Pt is bright, engaging and motivated for therapy.  ?Client Treatment Preferences  ?Individual therapy.  ?Client Statement of Needs  ?Improve coping skills.  ?Symptoms  ?Autonomic hyperactivity (e.g., palpitations, shortness of breath, dry mouth, trouble swallowing, nausea, diarrhea). Excessive and/or unrealistic worry that is difficult to control occurring more days than not for at least 6 months about a number of events or activities. Hypervigilance (e.g., feeling constantly on edge, experiencing concentration difficulties, having trouble falling or staying asleep, exhibiting a general state of irritability). Motor tension (e.g., restlessness, tiredness, shakiness, muscle tension). ?Problems Addressed  ?Anxiety ?Goals ?1. Enhance ability to effectively cope with the full variety of life's worries and anxieties. ?2. Learn and implement coping skills that result in a reduction of anxiety and worry, and  improved daily functioning. ?Objective ?Learn to accept limitations in life and commit to tolerating, rather than avoiding, unpleasant emotions while accomplishing meaningful goals. ?Target Date: 2022-06-29  Frequency:  Monthly ?Progress: 40 Modality: individual ?Related Interventions ?1. Use techniques from Acceptance and Commitment Therapy to help client accept uncomfortable realities such as lack of  complete control, imperfections, and uncertainty and tolerate unpleasant emotions and thoughts in order to accomplish value-consistent goals. ?Objective ?Learn and implement problem-solving strategies for realistically addressing worries. ?Target Date: 2022-06-29  Frequency:  Monthly ?Progress: 40 Modality: individual ?Related Interventions ?1. Assign the client a homework exercise in which he/she problem-solves a current problem.  review, reinforce success, and provide corrective feedback toward improvement. ?2. Teach the client problem-solving strategies involving specifically defining a problem, generating options for addressing it, evaluating the pros and cons of each option, selecting and implementing an optional action, and reevaluating and refining the action. ?Objective ?Learn and implement calming skills to reduce overall anxiety and manage anxiety symptoms. ?Target Date: 2022-06-29  Frequency: Monthly ?Progress: 40 Modality: individual ?Related Interventions ?1. Assign the client to read about progressive muscle relaxation and other calming strategies in relevant books or treatment manuals (e.g., Progressive Relaxation Training by Gwynneth Aliment and Dani Gobble; Mastery of Your Anxiety and Worry: Workbook by Beckie Busing). ?2. Assign the client homework each session in which he/she practices relaxation exercises daily, gradually applying them progressively from non-anxiety-provoking to anxiety-provoking situations; review and reinforce success while providing corrective feedback toward improvement. ?3. Teach the client calming/relaxation skills (e.g., applied relaxation, progressive muscle relaxation, cue controlled relaxation; mindful breathing; biofeedback) and how to discriminate better between relaxation and tension; teach the client how to apply these skills to his/her daily life. ?3. Reduce overall frequency, intensity, and duration of the anxiety so that daily functioning is not impaired. ?4. Resolve  the core conflict that is the source of anxiety. ?5. Stabilize anxiety level while increasing ability to function on a daily basis. ?Diagnosis :   F43.22 ?Conditions For Discharge ?Achievement of treatment goals and objectives. ? ?Lamin Chandley, LCSW ? ? ? ?

## 2021-07-27 ENCOUNTER — Encounter: Payer: Self-pay | Admitting: Cardiology

## 2021-07-27 ENCOUNTER — Ambulatory Visit: Payer: Medicare Other | Admitting: Cardiology

## 2021-07-27 VITALS — BP 149/86 | HR 60 | Temp 98.7°F | Resp 16 | Ht 62.0 in | Wt 139.4 lb

## 2021-07-27 DIAGNOSIS — E782 Mixed hyperlipidemia: Secondary | ICD-10-CM | POA: Diagnosis not present

## 2021-07-27 DIAGNOSIS — I10 Essential (primary) hypertension: Secondary | ICD-10-CM | POA: Diagnosis not present

## 2021-07-27 DIAGNOSIS — I201 Angina pectoris with documented spasm: Secondary | ICD-10-CM

## 2021-07-27 MED ORDER — AMLODIPINE BESYLATE 5 MG PO TABS: 5.0000 mg | ORAL_TABLET | Freq: Every day | ORAL | 2 refills | Status: AC

## 2021-07-27 NOTE — Progress Notes (Signed)
? ?Primary Physician/Referring:  Sharilyn Sites, MD ? ?Patient ID: Virginia Brooks, female    DOB: [birth year], 71 y.o.   MRN: [id number] ? ?Chief Complaint  ?Patient presents with  ? Chest Pain  ? Follow-up  ?  6 weeks  ? ?HPI:   ? ?Virginia Brooks  is a 71 y.o. Caucasian female patient with hyperlipidemia, family history of cerebral saccular aneurysm, vitamin D deficiency, referred to me for evaluation of chest pain by Dr. Eddie Candle.  I had seen her 6 weeks ago. ? ?Chest pain is described as tightness in the middle of the chest, rarely she feels?  Radiation to the neck.  Each episode last anywhere from 3 to 4 minutes, almost occurring at rest and not with exertion activity.  She has about 2-3 episodes in a month and sometimes no anginal symptoms are all over 2 to 3 months.  Since last seen, she has not had any further chest pain.  Remains asymptomatic. ? ?Past Medical History:  ?Diagnosis Date  ? Endometrial hyperplasia without atypia, complex 04/24/2004  ? on hysteroscopic resection of polyps  ? History of COVID-19 09/27/2020  ? Hypercholesterolemia   ? Keratosis   ? Malignant melanoma of left forearm ([place])   ? Shingles 11/2015  ? Spondylosis of cervical joint   ? ?Past Surgical History:  ?Procedure Laterality Date  ? BREAST SURGERY  Dec 2000  ? excision papilloma  ? DILATION AND CURETTAGE OF UTERUS    ? HYSTEROSCOPY WITH RESECTOSCOPE  04/24/2004  ? MINOR BREAST BIOPSY Right 1994  ? ?Family History  ?Problem Relation Age of Onset  ? COPD Mother   ? Angina Mother   ? Stroke Father   ? Heart attack Father   ? Colon cancer Sister 65  ?     colon ca  ? Cancer Sister   ? Cancer Brother   ?     prostate/bladder/kidney ca  ? Colon cancer Other   ?     12   ?  ?Social History  ? ?Tobacco Use  ? Smoking status: Never  ? Smokeless tobacco: Never  ?Substance Use Topics  ? Alcohol use: No  ?  Alcohol/week: 0.0 standard drinks  ? ?Marital Status: Widowed  ?ROS  ?Review of Systems  ?Cardiovascular:  Negative for chest pain, dyspnea on  exertion and leg swelling.  ?Objective  ?Blood pressure (!) 149/86, pulse 60, temperature 98.7 ?F (37.1 ?C), temperature source Temporal, resp. rate 16, height 5' 2"  (1.575 m), weight 139 lb 6.4 oz (63.2 kg), last menstrual period 04/02/2004, SpO2 96 %. Body mass index is 25.5 kg/m?.  ? ?  07/27/2021  ? 10:49 AM 06/19/2021  ? 11:36 AM 12/08/2020  ?  1:47 PM  ?Vitals with BMI  ?Height 5' 2"  5' 2"  5' 2.75"  ?Weight 139 lbs 6 oz 138 lbs 10 oz 136 lbs  ?BMI 25.49 25.34 24.28  ?Systolic 124 580 998  ?Diastolic 86 83 78  ?Pulse 60 57 59  ?  ?Physical Exam ?Neck:  ?   Vascular: No JVD.  ?Cardiovascular:  ?   Rate and Rhythm: Normal rate and regular rhythm.  ?   Pulses: Intact distal pulses.  ?   Heart sounds: Normal heart sounds. No murmur heard. ?  No gallop.  ?Pulmonary:  ?   Effort: Pulmonary effort is normal.  ?   Breath sounds: Normal breath sounds.  ?Abdominal:  ?   General: Bowel sounds are normal.  ?   Palpations:  Abdomen is soft.  ?Musculoskeletal:  ?   Right lower leg: No edema.  ?   Left lower leg: No edema.  ?  ? ?Medications and allergies  ?No Known Allergies  ? ?Medication list after today's encounter  ? ?Current Outpatient Medications:  ?  amLODipine (NORVASC) 5 MG tablet, Take 1 tablet (5 mg total) by mouth daily., Disp: 30 tablet, Rfl: 2 ?  ASPIRIN LOW DOSE 81 MG EC tablet, Take 81 mg by mouth daily., Disp: , Rfl:  ?  cetirizine (ZYRTEC) 10 MG tablet, Take 10 mg by mouth daily. Takes 1/2 tablet daily, Disp: , Rfl:  ?  cholecalciferol (VITAMIN D) 1000 UNITS tablet, Take 1,000 Units by mouth daily., Disp: , Rfl:  ?  fish oil-omega-3 fatty acids 1000 MG capsule, Take 2 g by mouth daily., Disp: , Rfl:  ?  ketotifen (ZADITOR) 0.025 % ophthalmic solution, 1 drop 2 (two) times daily., Disp: , Rfl:  ?  MULTIPLE VITAMINS PO, Take 1 tablet by mouth daily., Disp: , Rfl:  ?  rosuvastatin (CRESTOR) 5 MG tablet, Take 1 tablet (5 mg total) by mouth daily., Disp: 90 tablet, Rfl: 3 ? ?Laboratory examination:  ? ?Lipid profile  07/25/2021: ? ?Total cholesterol 153, triglycerides 158, HDL 58, LDL 68. ? ?External labs:  ? ?Labs 12/12/2020: ? ?Hb 13.9/HCT 40.6, platelets 208, normal indicis. ? ?Serum glucose 89 mg, BUN 10, creatinine 0.73, EGFR 87,, potassium 4.6.  LFTs normal. ? ?Total cholesterol 173, triglycerides 220, HDL 50, LDL 86. ? ?TSH normal at 1.010. ? ?Radiology:  ? ?NA ? ?Cardiac Studies:  ? ?[facility]V CARDIAC STRESS TEST 07/14/2021  ?The patient exercised for 6 minutes and 32 seconds on Bruce protocol; achieved 7.84 METs at 86% of maximum predicted heart rate. Resting ECG demonstrated normal sinus rhythm with left ventricular hypertrophy.  Non-specific T-wave abnormalities. Peak ECG demonstrated no ST-T wave abnormalities. ?Chest pain is not present. ?No arrhythmias noted on ECG at stress. ?The heart rate response was normal. ?The blood pressure response was normal. The baseline blood pressure was 150/80 mmHg and increased to 160/80 mmHg at peak exercise, which is a normal response to exercise. ?Normal treadmill stress. Low risk. ?  ?PCV ECHOCARDIOGRAM COMPLETE 06/23/2021  ?Normal LV systolic function with EF 57%. Left ventricle cavity is normal in size. Normal global wall motion. Normal diastolic filling pattern. Calculated EF 57%. ?Trileaflet aortic valve.  Mild (Grade I) aortic regurgitation. ?Structurally normal mitral valve.  Flat co-aption without obvious prolapse. Mild (Grade I) mitral regurgitation. ?Structurally normal tricuspid valve.  Mild tricuspid regurgitation. No evidence of pulmonary hypertension. ?   ?EKG:  ? ?EKG 06/19/2021: Normal sinus rhythm at rate of 52 bpm, right atrial enlargement, normal axis.  Nonspecific T abnormality.   ? ?Assessment  ? ?  ICD-10-CM   ?1. Prinzmetal angina (HCC)  I20.1 amLODipine (NORVASC) 5 MG tablet  ?  ?2. Mixed hyperlipidemia  E78.2 Lipid Panel With LDL/HDL Ratio  ?  ?3. Primary hypertension  I10 amLODipine (NORVASC) 5 MG tablet  ?  ?  ?There are no discontinued medications.  ?Meds  ordered this encounter  ?Medications  ? amLODipine (NORVASC) 5 MG tablet  ?  Sig: Take 1 tablet (5 mg total) by mouth daily.  ?  Dispense:  30 tablet  ?  Refill:  2  ? ?No orders of the defined types were placed in this encounter. ? ?Recommendations:  ? ?Virginia Brooks is a 71 y.o.  Caucasian female patient with hyperlipidemia, family history of  cerebral saccular aneurysm, vitamin D deficiency, referred to me for evaluation of chest pain by Dr. Eddie Candle.  I had seen her 6 weeks ago. ? ?Reviewed the results of the echocardiogram, she has very mild mitral regurg and aortic regurgitation, on physical exam no murmur is appreciated.  Unless she develops a murmur, no further evaluation is indicated. ? ?With regard to chest pain symptoms, symptoms appear to be more indicative of Prinzmetal's angina pectoris.  She also has primary hypertension.  She brings home recordings of blood pressure which are clearly elevated as well as in our office today.  I have started her on amlodipine 5 mg daily for both hypertension and angina pectoris.  Side effects of leg edema and constipation discussed.  Chest pain symptoms are very mild and occur maybe once or twice a month and sometimes she has no anginal symptoms or 2 to 3 months. ? ?Otherwise she is stable from cardiac standpoint, lipids have improved significantly, I did increase the dose of Crestor from 2.5 to 5 mg which she is tolerating.  She can now follow-up with Dr. Jenny Reichmann Ulice Dash) Hilma Favors for further management, I will see her back in 2 years in view of mild valvular heart disease and patient preference.  ? ? ? ?Adrian Prows, MD, [facility] ?07/27/2021, 11:09 AM ?Office: [phone] ?

## 2021-08-23 ENCOUNTER — Ambulatory Visit (INDEPENDENT_AMBULATORY_CARE_PROVIDER_SITE_OTHER): Payer: Medicare Other | Admitting: Psychology

## 2021-08-23 DIAGNOSIS — F4322 Adjustment disorder with anxiety: Secondary | ICD-10-CM | POA: Diagnosis not present

## 2021-08-23 NOTE — Progress Notes (Signed)
[facility] Counselor/Therapist Progress Note  Patient ID: Virginia Brooks, MRN: [id number],    Date: 08/23/2021  Time Spent: 10:00am-10:55am   55 minutes   Treatment Type: Individual Therapy  Reported Symptoms: stress  Mental Status Exam: Appearance:  Casual and Neat     Behavior: Appropriate  Motor: Normal  Speech/Language:  Normal Rate  Affect: Appropriate  Mood: normal  Thought process: normal  Thought content:   WNL  Sensory/Perceptual disturbances:   WNL  Orientation: oriented to person, place, time/date, and situation  Attention: Good  Concentration: Good  Memory: WNL  Fund of knowledge:  Good  Insight:   Good  Judgment:  Good  Impulse Control: Good   Risk Assessment: Danger to Self:  No Self-injurious Behavior: No Danger to Others: No Duty to Warn:no Physical Aggression / Violence:No  Access to Firearms a concern: No  Gang Involvement:No   Subjective:  Pt present for face-to-face individual therapy via video Webex.  Pt consents to telehealth video session due to COVID 19 pandemic. Location of pt: home Location of therapist: home office.   Pt talked about her Mother's Day.  She went to [facility] with her kids and their families.  On the way home the car malfunctioned and they had to be towed home.  Addressed the stress of that incident.    Pt had a class reunion last week and it went well.  Pt helped plan the event.   Pt talked about her neighbor who died a few weeks ago.  The adult children are getting rid of all the stuff in his house.  It makes pt sad to see his things thrown away.  It makes her think about wanting to clean out her surplus "stuff" so her children will not have to deal with it and throw it all away.   Pt will have her birthday June 4th and will turn 55.  Pt thinks about not having much time left.  Addressed pt's thoughts and feelings about her mortality.   Pt talked about her health.  She saw her cardiologist who put her on blood  pressure medication.   [name] heart is in good shape overall.   Pt talked about finally hearing from the IRS about her tax audit.   The audit is still being worked on.   Pt talked about her brother Virginia Brooks.   Pt is transporting him to dentist and doctors' appointments.  He has been needy and is asking for pt to take him place he can fly to.   Pt set a limit on that request.  She does not feel he is in good enough shape to travel to that extent.   Provided supportive therapy.   Interventions: Cognitive Behavioral Therapy and Insight-Oriented  Diagnosis: F43.22  Plan of Care: Recommend ongoing therapy.   Plan to meet monthly.  Pt is progressing toward treatment goals.    Treatment Plan (Treatment Plan Target Date:  06/29/2022)  Client Abilities/Strengths  Pt is bright, engaging and motivated for therapy.  Client Treatment Preferences  Individual therapy.  Client Statement of Needs  Improve coping skills.  Symptoms  Autonomic hyperactivity (e.g., palpitations, shortness of breath, dry mouth, trouble swallowing, nausea, diarrhea). Excessive and/or unrealistic worry that is difficult to control occurring more days than not for at least 6 months about a number of events or activities. Hypervigilance (e.g., feeling constantly on edge, experiencing concentration difficulties, having trouble falling or staying asleep, exhibiting a general state of irritability). Motor tension (e.g., restlessness,  tiredness, shakiness, muscle tension). Problems Addressed  Anxiety Goals 1. Enhance ability to effectively cope with the full variety of life's worries and anxieties. 2. Learn and implement coping skills that result in a reduction of anxiety and worry, and improved daily functioning. Objective Learn to accept limitations in life and commit to tolerating, rather than avoiding, unpleasant emotions while accomplishing meaningful goals. Target Date: 2022-06-29  Frequency:  Monthly Progress: 40 Modality:  individual Related Interventions 1. Use techniques from Acceptance and Commitment Therapy to help client accept uncomfortable realities such as lack of complete control, imperfections, and uncertainty and tolerate unpleasant emotions and thoughts in order to accomplish value-consistent goals. Objective Learn and implement problem-solving strategies for realistically addressing worries. Target Date: 2022-06-29  Frequency:  Monthly Progress: 40 Modality: individual Related Interventions 1. Assign the client a homework exercise in which he/she problem-solves a current problem.  review, reinforce success, and provide corrective feedback toward improvement. 2. Teach the client problem-solving strategies involving specifically defining a problem, generating options for addressing it, evaluating the pros and cons of each option, selecting and implementing an optional action, and reevaluating and refining the action. Objective Learn and implement calming skills to reduce overall anxiety and manage anxiety symptoms. Target Date: 2022-06-29  Frequency: Monthly Progress: 40 Modality: individual Related Interventions 1. Assign the client to read about progressive muscle relaxation and other calming strategies in relevant books or treatment manuals (e.g., Progressive Relaxation Training by Gwynneth Aliment and Dani Gobble; Mastery of Your Anxiety and Worry: Workbook by Beckie Busing). 2. Assign the client homework each session in which he/she practices relaxation exercises daily, gradually applying them progressively from non-anxiety-provoking to anxiety-provoking situations; review and reinforce success while providing corrective feedback toward improvement. 3. Teach the client calming/relaxation skills (e.g., applied relaxation, progressive muscle relaxation, cue controlled relaxation; mindful breathing; biofeedback) and how to discriminate better between relaxation and tension; teach the client how to apply these  skills to his/her daily life. 3. Reduce overall frequency, intensity, and duration of the anxiety so that daily functioning is not impaired. 4. Resolve the core conflict that is the source of anxiety. 5. Stabilize anxiety level while increasing ability to function on a daily basis. Diagnosis :   F43.22 Conditions For Discharge Achievement of treatment goals and objectives.  Ilda Laskin, LCSW

## 2021-09-20 ENCOUNTER — Ambulatory Visit (INDEPENDENT_AMBULATORY_CARE_PROVIDER_SITE_OTHER): Payer: Medicare Other | Admitting: Psychology

## 2021-09-20 DIAGNOSIS — F4322 Adjustment disorder with anxiety: Secondary | ICD-10-CM | POA: Diagnosis not present

## 2021-09-20 NOTE — Progress Notes (Signed)
[facility] Counselor/Therapist Progress Note  Patient ID: Virginia Brooks, MRN: [id number],    Date: 09/20/2021  Time Spent: 10:00am-10:55am   55 minutes   Treatment Type: Individual Therapy  Reported Symptoms: stress  Mental Status Exam: Appearance:  Casual and Neat     Behavior: Appropriate  Motor: Normal  Speech/Language:  Normal Rate  Affect: Appropriate  Mood: normal  Thought process: normal  Thought content:   WNL  Sensory/Perceptual disturbances:   WNL  Orientation: oriented to person, place, time/date, and situation  Attention: Good  Concentration: Good  Memory: WNL  Fund of knowledge:  Good  Insight:   Good  Judgment:  Good  Impulse Control: Good   Risk Assessment: Danger to Self:  No Self-injurious Behavior: No Danger to Others: No Duty to Warn:no Physical Aggression / Violence:No  Access to Firearms a concern: No  Gang Involvement:No   Subjective:  Pt present for face-to-face individual therapy via video Webex.  Pt consents to telehealth video session due to COVID 19 pandemic. Location of pt: home Location of therapist: home office.   Pt talked about getting work done on her house.   Pt states the past month has been busy but it has been a "good busy".   [name] oldest grandson graduated from high school and will be going to [employer] in the fall.   Pt had her birthday June 4th and had a nice celebration with family.   Pt talked about her 73 yo neighbor passing away and his adult children throwing away many dumpsters full of stuff.  Pt worries that her kids will throw away off of her things when she passes.   Pt wants to organize her stuff more but she has a hard time getting motivated to go through things and has a hard time letting go of stuff.   Pt talked about her brother Juanda Crumble.   Pt is helping him with doctors' and dentist appointments.  [name] worries about things and pt has to reassure him a lot.  Dealing with him can be tiring and  frustrating for pt.  Helped pt process her feelings and relationship dynamics.   Provided supportive therapy.   Interventions: Cognitive Behavioral Therapy and Insight-Oriented  Diagnosis: F43.22  Plan of Care: Recommend ongoing therapy.   Plan to meet monthly.  Pt is progressing toward treatment goals.    Treatment Plan (Treatment Plan Target Date:  06/29/2022)  Client Abilities/Strengths  Pt is bright, engaging and motivated for therapy.  Client Treatment Preferences  Individual therapy.  Client Statement of Needs  Improve coping skills.  Symptoms  Autonomic hyperactivity (e.g., palpitations, shortness of breath, dry mouth, trouble swallowing, nausea, diarrhea). Excessive and/or unrealistic worry that is difficult to control occurring more days than not for at least 6 months about a number of events or activities. Hypervigilance (e.g., feeling constantly on edge, experiencing concentration difficulties, having trouble falling or staying asleep, exhibiting a general state of irritability). Motor tension (e.g., restlessness, tiredness, shakiness, muscle tension). Problems Addressed  Anxiety Goals 1. Enhance ability to effectively cope with the full variety of life's worries and anxieties. 2. Learn and implement coping skills that result in a reduction of anxiety and worry, and improved daily functioning. Objective Learn to accept limitations in life and commit to tolerating, rather than avoiding, unpleasant emotions while accomplishing meaningful goals. Target Date: 2022-06-29  Frequency:  Monthly Progress: 40 Modality: individual Related Interventions 1. Use techniques from Acceptance and Commitment Therapy to help client accept  uncomfortable realities such as lack of complete control, imperfections, and uncertainty and tolerate unpleasant emotions and thoughts in order to accomplish value-consistent goals. Objective Learn and implement problem-solving strategies for realistically  addressing worries. Target Date: 2022-06-29  Frequency:  Monthly Progress: 40 Modality: individual Related Interventions 1. Assign the client a homework exercise in which he/she problem-solves a current problem.  review, reinforce success, and provide corrective feedback toward improvement. 2. Teach the client problem-solving strategies involving specifically defining a problem, generating options for addressing it, evaluating the pros and cons of each option, selecting and implementing an optional action, and reevaluating and refining the action. Objective Learn and implement calming skills to reduce overall anxiety and manage anxiety symptoms. Target Date: 2022-06-29  Frequency: Monthly Progress: 40 Modality: individual Related Interventions 1. Assign the client to read about progressive muscle relaxation and other calming strategies in relevant books or treatment manuals (e.g., Progressive Relaxation Training by Gwynneth Aliment and Dani Gobble; Mastery of Your Anxiety and Worry: Workbook by Beckie Busing). 2. Assign the client homework each session in which he/she practices relaxation exercises daily, gradually applying them progressively from non-anxiety-provoking to anxiety-provoking situations; review and reinforce success while providing corrective feedback toward improvement. 3. Teach the client calming/relaxation skills (e.g., applied relaxation, progressive muscle relaxation, cue controlled relaxation; mindful breathing; biofeedback) and how to discriminate better between relaxation and tension; teach the client how to apply these skills to his/her daily life. 3. Reduce overall frequency, intensity, and duration of the anxiety so that daily functioning is not impaired. 4. Resolve the core conflict that is the source of anxiety. 5. Stabilize anxiety level while increasing ability to function on a daily basis. Diagnosis :   F43.22 Conditions For Discharge Achievement of treatment goals and  objectives.  Verle Wheeling, LCSW

## 2021-10-17 DIAGNOSIS — I10 Essential (primary) hypertension: Secondary | ICD-10-CM | POA: Diagnosis not present

## 2021-10-17 DIAGNOSIS — Z6828 Body mass index (BMI) 28.0-28.9, adult: Secondary | ICD-10-CM | POA: Diagnosis not present

## 2021-10-17 DIAGNOSIS — E6609 Other obesity due to excess calories: Secondary | ICD-10-CM | POA: Diagnosis not present

## 2021-10-23 ENCOUNTER — Ambulatory Visit (INDEPENDENT_AMBULATORY_CARE_PROVIDER_SITE_OTHER): Payer: Medicare Other | Admitting: Psychology

## 2021-10-23 DIAGNOSIS — F4322 Adjustment disorder with anxiety: Secondary | ICD-10-CM | POA: Diagnosis not present

## 2021-10-23 NOTE — Progress Notes (Signed)
[facility] Counselor/Therapist Progress Note  Patient ID: Virginia Brooks, MRN: [id number],    Date: 10/23/2021  Time Spent: 5:00pm-5:55pm   55 minutes   Treatment Type: Individual Therapy  Reported Symptoms: stress  Mental Status Exam: Appearance:  Casual and Neat     Behavior: Appropriate  Motor: Normal  Speech/Language:  Normal Rate  Affect: Appropriate  Mood: normal  Thought process: normal  Thought content:   WNL  Sensory/Perceptual disturbances:   WNL  Orientation: oriented to person, place, time/date, and situation  Attention: Good  Concentration: Good  Memory: WNL  Fund of knowledge:  Good  Insight:   Good  Judgment:  Good  Impulse Control: Good   Risk Assessment: Danger to Self:  No Self-injurious Behavior: No Danger to Others: No Duty to Warn:no Physical Aggression / Violence:No  Access to Firearms a concern: No  Gang Involvement:No   Subjective:  Pt present for face-to-face individual therapy via video Webex.  Pt consents to telehealth video session due to COVID 19 pandemic. Location of pt: home Location of therapist: home office.   Pt talked about her daughter's family.  They have been traveling to [facility]. Pt talked about her church planning a [place] trip next summer.  Pt would like to go but is not sure she will be able to.    Pt has joined a widow's group at church and has enjoyed getting to know the other members.   Pt talked about her brother Juanda Crumble.   Pt is helping him with doctors' and dentist appointments and errands.  [name] worries about things and pt has to reassure him a lot.  Dealing with him can be tiring and frustrating for pt.  Helped pt process her feelings and relationship dynamics.  Worked on self care strategies.    Provided supportive therapy.   Interventions: Cognitive Behavioral Therapy and Insight-Oriented  Diagnosis: F43.22  Plan of Care: Recommend ongoing therapy.   Plan to meet monthly.  Pt is progressing  toward treatment goals.    Treatment Plan (Treatment Plan Target Date:  06/29/2022)  Client Abilities/Strengths  Pt is bright, engaging and motivated for therapy.  Client Treatment Preferences  Individual therapy.  Client Statement of Needs  Improve coping skills.  Symptoms  Autonomic hyperactivity (e.g., palpitations, shortness of breath, dry mouth, trouble swallowing, nausea, diarrhea). Excessive and/or unrealistic worry that is difficult to control occurring more days than not for at least 6 months about a number of events or activities. Hypervigilance (e.g., feeling constantly on edge, experiencing concentration difficulties, having trouble falling or staying asleep, exhibiting a general state of irritability). Motor tension (e.g., restlessness, tiredness, shakiness, muscle tension). Problems Addressed  Anxiety Goals 1. Enhance ability to effectively cope with the full variety of life's worries and anxieties. 2. Learn and implement coping skills that result in a reduction of anxiety and worry, and improved daily functioning. Objective Learn to accept limitations in life and commit to tolerating, rather than avoiding, unpleasant emotions while accomplishing meaningful goals. Target Date: 2022-06-29  Frequency:  Monthly Progress: 40 Modality: individual Related Interventions 1. Use techniques from Acceptance and Commitment Therapy to help client accept uncomfortable realities such as lack of complete control, imperfections, and uncertainty and tolerate unpleasant emotions and thoughts in order to accomplish value-consistent goals. Objective Learn and implement problem-solving strategies for realistically addressing worries. Target Date: 2022-06-29  Frequency:  Monthly Progress: 40 Modality: individual Related Interventions 1. Assign the client a homework exercise in which he/she problem-solves a current problem.  review, reinforce success, and provide corrective feedback toward  improvement. 2. Teach the client problem-solving strategies involving specifically defining a problem, generating options for addressing it, evaluating the pros and cons of each option, selecting and implementing an optional action, and reevaluating and refining the action. Objective Learn and implement calming skills to reduce overall anxiety and manage anxiety symptoms. Target Date: 2022-06-29  Frequency: Monthly Progress: 40 Modality: individual Related Interventions 1. Assign the client to read about progressive muscle relaxation and other calming strategies in relevant books or treatment manuals (e.g., Progressive Relaxation Training by Gwynneth Aliment and Dani Gobble; Mastery of Your Anxiety and Worry: Workbook by Beckie Busing). 2. Assign the client homework each session in which he/she practices relaxation exercises daily, gradually applying them progressively from non-anxiety-provoking to anxiety-provoking situations; review and reinforce success while providing corrective feedback toward improvement. 3. Teach the client calming/relaxation skills (e.g., applied relaxation, progressive muscle relaxation, cue controlled relaxation; mindful breathing; biofeedback) and how to discriminate better between relaxation and tension; teach the client how to apply these skills to his/her daily life. 3. Reduce overall frequency, intensity, and duration of the anxiety so that daily functioning is not impaired. 4. Resolve the core conflict that is the source of anxiety. 5. Stabilize anxiety level while increasing ability to function on a daily basis. Diagnosis :   F43.22 Conditions For Discharge Achievement of treatment goals and objectives.  Latrell Reitan, LCSW

## 2021-11-03 DIAGNOSIS — Z13820 Encounter for screening for osteoporosis: Secondary | ICD-10-CM | POA: Diagnosis not present

## 2021-11-03 DIAGNOSIS — Z1231 Encounter for screening mammogram for malignant neoplasm of breast: Secondary | ICD-10-CM | POA: Diagnosis not present

## 2021-11-06 ENCOUNTER — Encounter: Payer: Self-pay | Admitting: Obstetrics and Gynecology

## 2021-11-22 ENCOUNTER — Ambulatory Visit (INDEPENDENT_AMBULATORY_CARE_PROVIDER_SITE_OTHER): Payer: Medicare Other | Admitting: Psychology

## 2021-11-22 DIAGNOSIS — F4322 Adjustment disorder with anxiety: Secondary | ICD-10-CM

## 2021-11-22 NOTE — Progress Notes (Signed)
[facility] Counselor/Therapist Progress Note  Patient ID: MARIJKE GUADIANA, MRN: [id number],    Date: 8/73/2023  Time Spent: 10:00am-10:55am   55 minutes   Treatment Type: Individual Therapy  Reported Symptoms: stress  Mental Status Exam: Appearance:  Casual and Neat     Behavior: Appropriate  Motor: Normal  Speech/Language:  Normal Rate  Affect: Appropriate  Mood: normal  Thought process: normal  Thought content:   WNL  Sensory/Perceptual disturbances:   WNL  Orientation: oriented to person, place, time/date, and situation  Attention: Good  Concentration: Good  Memory: WNL  Fund of knowledge:  Good  Insight:   Good  Judgment:  Good  Impulse Control: Good   Risk Assessment: Danger to Self:  No Self-injurious Behavior: No Danger to Others: No Duty to Warn:no Physical Aggression / Violence:No  Access to Firearms a concern: No  Gang Involvement:No   Subjective:  Pt present for face-to-face individual therapy via video Webex.  Pt consents to telehealth video session due to COVID 19 pandemic. Location of pt: home Location of therapist: home office.   Pt talked about her grandson going to college for his freshman year.   Pt talked about her 33 yo nephew having a stroke.  He has had to have rehab but he still has deficits physically and with his speech.  Addressed pt's concerns about her nephew.   Pt talked about her brother Juanda Crumble.   Pt continues to take him to doctors appointments and errands.  Juanda Crumble has had issues with his home appliances that pt had to help him with.  [name] worries about things and pt has to reassure him a lot.  Dealing with him can be tiring and frustrating for pt.  Pt is worried that eventually Juanda Crumble may need to move in with her which would be very challenging for her.  Helped pt process her feelings and relationship dynamics.  Worked on self care strategies.    Provided supportive therapy.   Interventions: Cognitive Behavioral  Therapy and Insight-Oriented  Diagnosis: F43.22  Plan of Care: Recommend ongoing therapy.   Plan to meet monthly.  Pt is progressing toward treatment goals.    Treatment Plan (Treatment Plan Target Date:  3/73/2024)  Client Abilities/Strengths  Pt is bright, engaging and motivated for therapy.  Client Treatment Preferences  Individual therapy.  Client Statement of Needs  Improve coping skills.  Symptoms  Autonomic hyperactivity (e.g., palpitations, shortness of breath, dry mouth, trouble swallowing, nausea, diarrhea). Excessive and/or unrealistic worry that is difficult to control occurring more days than not for at least 6 months about a number of events or activities. Hypervigilance (e.g., feeling constantly on edge, experiencing concentration difficulties, having trouble falling or staying asleep, exhibiting a general state of irritability). Motor tension (e.g., restlessness, tiredness, shakiness, muscle tension). Problems Addressed  Anxiety Goals 1. Enhance ability to effectively cope with the full variety of life's worries and anxieties. 2. Learn and implement coping skills that result in a reduction of anxiety and worry, and improved daily functioning. Objective Learn to accept limitations in life and commit to tolerating, rather than avoiding, unpleasant emotions while accomplishing meaningful goals. Target Date: 2022-06-29  Frequency:  Monthly Progress: 40 Modality: individual Related Interventions 1. Use techniques from Acceptance and Commitment Therapy to help client accept uncomfortable realities such as lack of complete control, imperfections, and uncertainty and tolerate unpleasant emotions and thoughts in order to accomplish value-consistent goals. Objective Learn and implement problem-solving strategies for realistically addressing worries. Target Date: 2022-06-29  Frequency:  Monthly Progress: 40 Modality: individual Related Interventions 1. Assign the client a  homework exercise in which he/she problem-solves a current problem.  review, reinforce success, and provide corrective feedback toward improvement. 2. Teach the client problem-solving strategies involving specifically defining a problem, generating options for addressing it, evaluating the pros and cons of each option, selecting and implementing an optional action, and reevaluating and refining the action. Objective Learn and implement calming skills to reduce overall anxiety and manage anxiety symptoms. Target Date: 2022-06-29  Frequency: Monthly Progress: 40 Modality: individual Related Interventions 1. Assign the client to read about progressive muscle relaxation and other calming strategies in relevant books or treatment manuals (e.g., Progressive Relaxation Training by Gwynneth Aliment and Dani Gobble; Mastery of Your Anxiety and Worry: Workbook by Beckie Busing). 2. Assign the client homework each session in which he/she practices relaxation exercises daily, gradually applying them progressively from non-anxiety-provoking to anxiety-provoking situations; review and reinforce success while providing corrective feedback toward improvement. 3. Teach the client calming/relaxation skills (e.g., applied relaxation, progressive muscle relaxation, cue controlled relaxation; mindful breathing; biofeedback) and how to discriminate better between relaxation and tension; teach the client how to apply these skills to his/her daily life. 3. Reduce overall frequency, intensity, and duration of the anxiety so that daily functioning is not impaired. 4. Resolve the core conflict that is the source of anxiety. 5. Stabilize anxiety level while increasing ability to function on a daily basis. Diagnosis :   F43.22 Conditions For Discharge Achievement of treatment goals and objectives.  Carsen Machi, LCSW

## 2021-12-06 ENCOUNTER — Encounter: Payer: Self-pay | Admitting: Obstetrics and Gynecology

## 2021-12-06 ENCOUNTER — Other Ambulatory Visit (HOSPITAL_COMMUNITY)
Admission: RE | Admit: 2021-12-06 | Discharge: 2021-12-06 | Disposition: A | Discharge status: Home / Self Care | Payer: Medicare Other | Source: Ambulatory Visit | Attending: Nurse Practitioner | Admitting: Nurse Practitioner

## 2021-12-06 ENCOUNTER — Ambulatory Visit (INDEPENDENT_AMBULATORY_CARE_PROVIDER_SITE_OTHER): Payer: Medicare Other | Admitting: Obstetrics and Gynecology

## 2021-12-06 VITALS — BP 112/72 | HR 59 | Resp 97 | Ht 63.0 in | Wt 138.0 lb

## 2021-12-06 DIAGNOSIS — Z124 Encounter for screening for malignant neoplasm of cervix: Secondary | ICD-10-CM

## 2021-12-06 DIAGNOSIS — Z9189 Other specified personal risk factors, not elsewhere classified: Secondary | ICD-10-CM

## 2021-12-06 DIAGNOSIS — Z1151 Encounter for screening for human papillomavirus (HPV): Secondary | ICD-10-CM | POA: Diagnosis not present

## 2021-12-06 DIAGNOSIS — Z8742 Personal history of other diseases of the female genital tract: Secondary | ICD-10-CM

## 2021-12-06 DIAGNOSIS — Z01419 Encounter for gynecological examination (general) (routine) without abnormal findings: Secondary | ICD-10-CM

## 2021-12-06 NOTE — Progress Notes (Signed)
71 y.o. G2P2 Widowed Caucasian female here for annual well woman visit with breast and pelvic exam.    Denies vaginal bleeding.  No GYN concerns.   Had echocardiogram and stress test 07/2021. Told she had a mild leak in aortic and mitral valve.  PCP: Rondell Reams   Cardiology:  Adrian Prows, MD  Patient's last menstrual period was 04/02/2004.           Sexually active: No.  The current method of family planning is post menopausal status.    Exercising: Yes.     Walks 1 mile daily Smoker:  no  Health Maintenance: Pap:   12-03-19 Neg, 08-01-17 Neg, 07-20-15 Neg  History of abnormal Pap:  Yes,  years ago per patient. MMG:  11-06-21 Neg/BiRads2 Colonoscopy: 12/2020 hemorrhoids,some diverticulosis;5-10 years. Sister had colon cancer BMD: 11-06-21  Result :Normal.  Reviewed with patient today.  TDaP:  2017 Gardasil:   no HIV: donates blood Hep C: 12/2017 Neg Screening Labs:  PCP   reports that she has never smoked. She has never used smokeless tobacco. She reports that she does not drink alcohol and does not use drugs.  Past Medical History:  Diagnosis Date   Endometrial hyperplasia without atypia, complex 04/24/2004   on hysteroscopic resection of polyps   History of COVID-19 09/27/2020   Hypercholesterolemia    Hypertension    Keratosis    Malignant melanoma of left forearm ([place])    Shingles 11/2015   Spondylosis of cervical joint     Past Surgical History:  Procedure Laterality Date   BREAST SURGERY  Dec 2000   excision papilloma   DILATION AND CURETTAGE OF UTERUS     HYSTEROSCOPY WITH RESECTOSCOPE  04/24/2004   MINOR BREAST BIOPSY Right 1994    Current Outpatient Medications  Medication Sig Dispense Refill   ASPIRIN LOW DOSE 81 MG EC tablet Take 81 mg by mouth daily.     cetirizine (ZYRTEC) 10 MG tablet Take 10 mg by mouth daily. Takes 1/2 tablet daily     cholecalciferol (VITAMIN D) 1000 UNITS tablet Take 1,000 Units by mouth daily.     Coenzyme Q10 (COQ10) 200 MG CAPS  Take 1 tablet by mouth daily.     fish oil-omega-3 fatty acids 1000 MG capsule Take 2 g by mouth daily.     ketotifen (ZADITOR) 0.025 % ophthalmic solution 1 drop 2 (two) times daily.     MULTIPLE VITAMINS PO Take 1 tablet by mouth daily.     rosuvastatin (CRESTOR) 5 MG tablet Take 1 tablet (5 mg total) by mouth daily. 90 tablet 3   amLODipine (NORVASC) 5 MG tablet Take 1 tablet (5 mg total) by mouth daily. 30 tablet 2   No current facility-administered medications for this visit.    Family History  Problem Relation Age of Onset   COPD Mother    Angina Mother    Stroke Father    Heart attack Father    Colon cancer Sister 69       colon ca   Cancer Sister    Cancer Brother        prostate/bladder/kidney ca   Colon cancer Other        74     Review of Systems  All other systems reviewed and are negative.   Exam:   BP 112/72   Pulse (!) 59   Resp (!) 97   Ht '5\' 3"'$  (1.6 m)   Wt 138 lb (62.6 kg)   LMP  04/02/2004   BMI 24.45 kg/m     General appearance: alert, cooperative and appears stated age Head: normocephalic, without obvious abnormality, atraumatic Neck: no adenopathy, supple, symmetrical, trachea midline and thyroid normal to inspection and palpation Lungs: clear to auscultation bilaterally Breasts: normal appearance, no masses or tenderness, No nipple retraction or dimpling, No nipple discharge or bleeding, No axillary adenopathy Heart: regular rate and rhythm Abdomen: soft, non-tender; no masses, no organomegaly Extremities: extremities normal, atraumatic, no cyanosis or edema Skin: skin color, texture, turgor normal. No rashes or lesions Lymph nodes: cervical, supraclavicular, and axillary nodes normal. Neurologic: grossly normal  Pelvic: External genitalia:  no lesions              No abnormal inguinal nodes palpated.              Urethra:  normal appearing urethra with no masses, tenderness or lesions              Bartholins and Skenes: normal                  Vagina: normal appearing vagina with normal color and discharge, no lesions              Cervix: no lesions              Pap taken: yes. Bimanual Exam:  Uterus:  normal size, contour, position, consistency, mobility, non-tender              Adnexa: no mass, fullness, tenderness              Rectal exam: yes.  Confirms.              Anus:  normal sphincter tone, no lesions  Chaperone was present for exam:  Estill Bamberg, CMA  Assessment:   Well woman visit with gynecologic exam. Hx complex endometrial hyperplasia without atypia.  Hx excision of breast papilloma.  FH colon cancer.  Hx melanoma.  FH CVD.  Plan: Mammogram screening discussed. Self breast awareness reviewed. Pap and HR HPV collected. Guidelines for Calcium, Vitamin D, regular exercise program including cardiovascular and weight bearing exercise. Labs with PCP.   Follow up annually and prn.   After visit summary provided.

## 2021-12-06 NOTE — Patient Instructions (Signed)

## 2021-12-07 DIAGNOSIS — H25811 Combined forms of age-related cataract, right eye: Secondary | ICD-10-CM | POA: Diagnosis not present

## 2021-12-07 DIAGNOSIS — H2512 Age-related nuclear cataract, left eye: Secondary | ICD-10-CM | POA: Diagnosis not present

## 2021-12-08 DIAGNOSIS — Z6823 Body mass index (BMI) 23.0-23.9, adult: Secondary | ICD-10-CM | POA: Diagnosis not present

## 2021-12-08 DIAGNOSIS — I10 Essential (primary) hypertension: Secondary | ICD-10-CM | POA: Diagnosis not present

## 2021-12-08 DIAGNOSIS — E559 Vitamin D deficiency, unspecified: Secondary | ICD-10-CM | POA: Diagnosis not present

## 2021-12-08 DIAGNOSIS — I671 Cerebral aneurysm, nonruptured: Secondary | ICD-10-CM | POA: Diagnosis not present

## 2021-12-08 DIAGNOSIS — E785 Hyperlipidemia, unspecified: Secondary | ICD-10-CM | POA: Diagnosis not present

## 2021-12-08 DIAGNOSIS — Z1331 Encounter for screening for depression: Secondary | ICD-10-CM | POA: Diagnosis not present

## 2021-12-08 DIAGNOSIS — C4362 Malignant melanoma of left upper limb, including shoulder: Secondary | ICD-10-CM | POA: Diagnosis not present

## 2021-12-08 DIAGNOSIS — Z0001 Encounter for general adult medical examination with abnormal findings: Secondary | ICD-10-CM | POA: Diagnosis not present

## 2021-12-11 LAB — CYTOLOGY - PAP
Comment: NEGATIVE
Diagnosis: NEGATIVE
High risk HPV: NEGATIVE

## 2021-12-20 ENCOUNTER — Ambulatory Visit (INDEPENDENT_AMBULATORY_CARE_PROVIDER_SITE_OTHER): Payer: Medicare Other | Admitting: Psychology

## 2021-12-20 DIAGNOSIS — F4322 Adjustment disorder with anxiety: Secondary | ICD-10-CM

## 2021-12-20 NOTE — Progress Notes (Signed)
[facility] Counselor/Therapist Progress Note  Patient ID: Virginia Brooks, MRN: [id number],    Date: 12/20/2021  Time Spent: 10:00am-10:55am   55 minutes   Treatment Type: Individual Therapy  Reported Symptoms: stress  Mental Status Exam: Appearance:  Casual and Neat     Behavior: Appropriate  Motor: Normal  Speech/Language:  Normal Rate  Affect: Appropriate  Mood: normal  Thought process: normal  Thought content:   WNL  Sensory/Perceptual disturbances:   WNL  Orientation: oriented to person, place, time/date, and situation  Attention: Good  Concentration: Good  Memory: WNL  Fund of knowledge:  Good  Insight:   Good  Judgment:  Good  Impulse Control: Good   Risk Assessment: Danger to Self:  No Self-injurious Behavior: No Danger to Others: No Duty to Warn:no Physical Aggression / Violence:No  Access to Firearms a concern: No  Gang Involvement:No   Subjective:  Pt present for face-to-face individual therapy via video Webex.  Pt consents to telehealth video session due to COVID 19 pandemic. Location of pt: home Location of therapist: home office.   Pt talked about being busy.   Pt cleaned [name] bathroom for 2 hours bc it was so dirty.  It was a big job and difficult for pt but she did it bc of her dedication to acts of service.  Pt has been taking Juanda Crumble to doctors' appointments.  Pt has had to do a lot of caregiving for Juanda Crumble and it gets frustrating at times.   Helped pt process her feelings and relationship dynamics.  Pt talked about her oldest grandson being in college for 5 weeks now and they have had 2 lockdowns.   [name] grandson is safe but she was worried about him.   Pt talked about house projects.   She is having her crawl space encapsulated to prevent mold.  It is an expensive project which is stressful.   Pt is planning to go on a [place] missions trip in June.   She has been on many missions trips and is looking forward to this trip.   Pt  talked about 9/11 being the 19 year anniversary of her husband's death.  She misses him.  Worked on self care strategies.    Provided supportive therapy.   Interventions: Cognitive Behavioral Therapy and Insight-Oriented  Diagnosis: F43.22  Plan of Care: Recommend ongoing therapy.   Plan to meet monthly.  Pt is progressing toward treatment goals.    Treatment Plan (Treatment Plan Target Date:  06/29/2022)  Client Abilities/Strengths  Pt is bright, engaging and motivated for therapy.  Client Treatment Preferences  Individual therapy.  Client Statement of Needs  Improve coping skills.  Symptoms  Autonomic hyperactivity (e.g., palpitations, shortness of breath, dry mouth, trouble swallowing, nausea, diarrhea). Excessive and/or unrealistic worry that is difficult to control occurring more days than not for at least 6 months about a number of events or activities. Hypervigilance (e.g., feeling constantly on edge, experiencing concentration difficulties, having trouble falling or staying asleep, exhibiting a general state of irritability). Motor tension (e.g., restlessness, tiredness, shakiness, muscle tension). Problems Addressed  Anxiety Goals 1. Enhance ability to effectively cope with the full variety of life's worries and anxieties. 2. Learn and implement coping skills that result in a reduction of anxiety and worry, and improved daily functioning. Objective Learn to accept limitations in life and commit to tolerating, rather than avoiding, unpleasant emotions while accomplishing meaningful goals. Target Date: 2022-06-29  Frequency:  Monthly Progress: 40 Modality: individual Related  Interventions 1. Use techniques from Acceptance and Commitment Therapy to help client accept uncomfortable realities such as lack of complete control, imperfections, and uncertainty and tolerate unpleasant emotions and thoughts in order to accomplish value-consistent goals. Objective Learn and implement  problem-solving strategies for realistically addressing worries. Target Date: 2022-06-29  Frequency:  Monthly Progress: 40 Modality: individual Related Interventions 1. Assign the client a homework exercise in which he/she problem-solves a current problem.  review, reinforce success, and provide corrective feedback toward improvement. 2. Teach the client problem-solving strategies involving specifically defining a problem, generating options for addressing it, evaluating the pros and cons of each option, selecting and implementing an optional action, and reevaluating and refining the action. Objective Learn and implement calming skills to reduce overall anxiety and manage anxiety symptoms. Target Date: 2022-06-29  Frequency: Monthly Progress: 40 Modality: individual Related Interventions 1. Assign the client to read about progressive muscle relaxation and other calming strategies in relevant books or treatment manuals (e.g., Progressive Relaxation Training by Gwynneth Aliment and Dani Gobble; Mastery of Your Anxiety and Worry: Workbook by Beckie Busing). 2. Assign the client homework each session in which he/she practices relaxation exercises daily, gradually applying them progressively from non-anxiety-provoking to anxiety-provoking situations; review and reinforce success while providing corrective feedback toward improvement. 3. Teach the client calming/relaxation skills (e.g., applied relaxation, progressive muscle relaxation, cue controlled relaxation; mindful breathing; biofeedback) and how to discriminate better between relaxation and tension; teach the client how to apply these skills to his/her daily life. 3. Reduce overall frequency, intensity, and duration of the anxiety so that daily functioning is not impaired. 4. Resolve the core conflict that is the source of anxiety. 5. Stabilize anxiety level while increasing ability to function on a daily basis. Diagnosis :   F43.22 Conditions For  Discharge Achievement of treatment goals and objectives.  Lyfe Monger, LCSW

## 2022-01-04 DIAGNOSIS — L812 Freckles: Secondary | ICD-10-CM | POA: Diagnosis not present

## 2022-01-04 DIAGNOSIS — Z85820 Personal history of malignant melanoma of skin: Secondary | ICD-10-CM | POA: Diagnosis not present

## 2022-01-04 DIAGNOSIS — D1801 Hemangioma of skin and subcutaneous tissue: Secondary | ICD-10-CM | POA: Diagnosis not present

## 2022-01-04 DIAGNOSIS — L821 Other seborrheic keratosis: Secondary | ICD-10-CM | POA: Diagnosis not present

## 2022-01-17 ENCOUNTER — Ambulatory Visit (INDEPENDENT_AMBULATORY_CARE_PROVIDER_SITE_OTHER): Payer: Medicare Other | Admitting: Psychology

## 2022-01-17 DIAGNOSIS — F4322 Adjustment disorder with anxiety: Secondary | ICD-10-CM | POA: Diagnosis not present

## 2022-01-17 NOTE — Progress Notes (Signed)
[facility] Counselor/Therapist Progress Note  Patient ID: Virginia Brooks, MRN: [id number],    Date: 01/17/2022  Time Spent: 10:00am-10:55am   55 minutes   Treatment Type: Individual Therapy  Reported Symptoms: stress  Mental Status Exam: Appearance:  Casual and Neat     Behavior: Appropriate  Motor: Normal  Speech/Language:  Normal Rate  Affect: Appropriate  Mood: normal  Thought process: normal  Thought content:   WNL  Sensory/Perceptual disturbances:   WNL  Orientation: oriented to person, place, time/date, and situation  Attention: Good  Concentration: Good  Memory: WNL  Fund of knowledge:  Good  Insight:   Good  Judgment:  Good  Impulse Control: Good   Risk Assessment: Danger to Self:  No Self-injurious Behavior: No Danger to Others: No Duty to Warn:no Physical Aggression / Violence:No  Access to Firearms a concern: No  Gang Involvement:No   Subjective:  Pt present for face-to-face individual therapy via video Webex.  Pt consents to telehealth video session due to COVID 19 pandemic. Location of pt: home Location of therapist: home office.   Pt talked about having a busy month.   She had some [place] friends visit.   Pt had family birthday parties.   [name] brother Juanda Crumble had oral surgery and pt transported him to his procedure and appointments.   Pt has been busy with her grand children.   She is a big help to her daughter and son and their families.   Pt is having some home repairs done that have been frustrating to coordinate.    Pt talked about her nephew who had a stroke.  He has a lot of deficits in expressive language.  Addressed pt's concerns about him.   Worked on self care strategies.    Provided supportive therapy.   Interventions: Cognitive Behavioral Therapy and Insight-Oriented  Diagnosis: F43.22  Plan of Care: Recommend ongoing therapy.   Plan to meet monthly.  Pt is progressing toward treatment goals.    Treatment Plan  (Treatment Plan Target Date:  06/29/2022)  Client Abilities/Strengths  Pt is bright, engaging and motivated for therapy.  Client Treatment Preferences  Individual therapy.  Client Statement of Needs  Improve coping skills.  Symptoms  Autonomic hyperactivity (e.g., palpitations, shortness of breath, dry mouth, trouble swallowing, nausea, diarrhea). Excessive and/or unrealistic worry that is difficult to control occurring more days than not for at least 6 months about a number of events or activities. Hypervigilance (e.g., feeling constantly on edge, experiencing concentration difficulties, having trouble falling or staying asleep, exhibiting a general state of irritability). Motor tension (e.g., restlessness, tiredness, shakiness, muscle tension). Problems Addressed  Anxiety Goals 1. Enhance ability to effectively cope with the full variety of life's worries and anxieties. 2. Learn and implement coping skills that result in a reduction of anxiety and worry, and improved daily functioning. Objective Learn to accept limitations in life and commit to tolerating, rather than avoiding, unpleasant emotions while accomplishing meaningful goals. Target Date: 2022-06-29  Frequency:  Monthly Progress: 40 Modality: individual Related Interventions 1. Use techniques from Acceptance and Commitment Therapy to help client accept uncomfortable realities such as lack of complete control, imperfections, and uncertainty and tolerate unpleasant emotions and thoughts in order to accomplish value-consistent goals. Objective Learn and implement problem-solving strategies for realistically addressing worries. Target Date: 2022-06-29  Frequency:  Monthly Progress: 40 Modality: individual Related Interventions 1. Assign the client a homework exercise in which he/she problem-solves a current problem.  review, reinforce success, and provide  corrective feedback toward improvement. 2. Teach the client problem-solving  strategies involving specifically defining a problem, generating options for addressing it, evaluating the pros and cons of each option, selecting and implementing an optional action, and reevaluating and refining the action. Objective Learn and implement calming skills to reduce overall anxiety and manage anxiety symptoms. Target Date: 2022-06-29  Frequency: Monthly Progress: 40 Modality: individual Related Interventions 1. Assign the client to read about progressive muscle relaxation and other calming strategies in relevant books or treatment manuals (e.g., Progressive Relaxation Training by Gwynneth Aliment and Dani Gobble; Mastery of Your Anxiety and Worry: Workbook by Beckie Busing). 2. Assign the client homework each session in which he/she practices relaxation exercises daily, gradually applying them progressively from non-anxiety-provoking to anxiety-provoking situations; review and reinforce success while providing corrective feedback toward improvement. 3. Teach the client calming/relaxation skills (e.g., applied relaxation, progressive muscle relaxation, cue controlled relaxation; mindful breathing; biofeedback) and how to discriminate better between relaxation and tension; teach the client how to apply these skills to his/her daily life. 3. Reduce overall frequency, intensity, and duration of the anxiety so that daily functioning is not impaired. 4. Resolve the core conflict that is the source of anxiety. 5. Stabilize anxiety level while increasing ability to function on a daily basis. Diagnosis :   F43.22 Conditions For Discharge Achievement of treatment goals and objectives.  Jonovan Boedecker, LCSW

## 2022-01-18 DIAGNOSIS — Z23 Encounter for immunization: Secondary | ICD-10-CM | POA: Diagnosis not present

## 2022-02-14 ENCOUNTER — Ambulatory Visit (INDEPENDENT_AMBULATORY_CARE_PROVIDER_SITE_OTHER): Payer: Medicare Other | Admitting: Psychology

## 2022-02-14 DIAGNOSIS — F4322 Adjustment disorder with anxiety: Secondary | ICD-10-CM

## 2022-02-14 NOTE — Progress Notes (Signed)
[facility] Counselor/Therapist Progress Note  Patient ID: Virginia Brooks, MRN: [id number],    Date: 02/14/2022  Time Spent: 10:00am-10:55am   55 minutes   Treatment Type: Individual Therapy  Reported Symptoms: stress  Mental Status Exam: Appearance:  Casual and Neat     Behavior: Appropriate  Motor: Normal  Speech/Language:  Normal Rate  Affect: Appropriate  Mood: normal  Thought process: normal  Thought content:   WNL  Sensory/Perceptual disturbances:   WNL  Orientation: oriented to person, place, time/date, and situation  Attention: Good  Concentration: Good  Memory: WNL  Fund of knowledge:  Good  Insight:   Good  Judgment:  Good  Impulse Control: Good   Risk Assessment: Danger to Self:  No Self-injurious Behavior: No Danger to Others: No Duty to Warn:no Physical Aggression / Violence:No  Access to Firearms a concern: No  Gang Involvement:No   Subjective:  Pt present for face-to-face individual therapy via video Webex.  Pt consents to telehealth video session due to COVID 19 pandemic. Location of pt: home Location of therapist: home office.   Pt talked about having a busy schedule with family and friends and church.   Pt has been attending a widows group with people from church.   They had a dinner together.   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   Pt is buying Christmas gifts for needy families.   Pt is feeling the stress of the holidays.   She has so much more to do bc she is so charitable.   Addressed pt's stress and worked on Child psychotherapist.   Worked on self care strategies.    Provided supportive therapy.   Interventions: Cognitive Behavioral Therapy and Insight-Oriented  Diagnosis: F43.22  Plan of Care: Recommend ongoing therapy.   Plan to meet monthly.  Pt is progressing toward treatment goals.    Treatment Plan (Treatment Plan Target Date:  06/29/2022)  Client Abilities/Strengths  Pt  is bright, engaging and motivated for therapy.  Client Treatment Preferences  Individual therapy.  Client Statement of Needs  Improve coping skills.  Symptoms  Autonomic hyperactivity (e.g., palpitations, shortness of breath, dry mouth, trouble swallowing, nausea, diarrhea). Excessive and/or unrealistic worry that is difficult to control occurring more days than not for at least 6 months about a number of events or activities. Hypervigilance (e.g., feeling constantly on edge, experiencing concentration difficulties, having trouble falling or staying asleep, exhibiting a general state of irritability). Motor tension (e.g., restlessness, tiredness, shakiness, muscle tension). Problems Addressed  Anxiety Goals 1. Enhance ability to effectively cope with the full variety of life's worries and anxieties. 2. Learn and implement coping skills that result in a reduction of anxiety and worry, and improved daily functioning. Objective Learn to accept limitations in life and commit to tolerating, rather than avoiding, unpleasant emotions while accomplishing meaningful goals. Target Date: 2022-06-29  Frequency:  Monthly Progress: 40 Modality: individual Related Interventions 1. Use techniques from Acceptance and Commitment Therapy to help client accept uncomfortable realities such as lack of complete control, imperfections, and uncertainty and tolerate unpleasant emotions and thoughts in order to accomplish value-consistent goals. Objective Learn and implement problem-solving strategies for realistically addressing worries. Target Date: 2022-06-29  Frequency:  Monthly Progress: 40 Modality: individual Related Interventions 1. Assign the client a homework exercise in which he/she problem-solves a current problem.  review, reinforce success, and provide corrective feedback toward improvement. 2. Teach the client problem-solving strategies involving specifically defining  a problem, generating options for  addressing it, evaluating the pros and cons of each option, selecting and implementing an optional action, and reevaluating and refining the action. Objective Learn and implement calming skills to reduce overall anxiety and manage anxiety symptoms. Target Date: 2022-06-29  Frequency: Monthly Progress: 40 Modality: individual Related Interventions 1. Assign the client to read about progressive muscle relaxation and other calming strategies in relevant books or treatment manuals (e.g., Progressive Relaxation Training by Gwynneth Aliment and Dani Gobble; Mastery of Your Anxiety and Worry: Workbook by Beckie Busing). 2. Assign the client homework each session in which he/she practices relaxation exercises daily, gradually applying them progressively from non-anxiety-provoking to anxiety-provoking situations; review and reinforce success while providing corrective feedback toward improvement. 3. Teach the client calming/relaxation skills (e.g., applied relaxation, progressive muscle relaxation, cue controlled relaxation; mindful breathing; biofeedback) and how to discriminate better between relaxation and tension; teach the client how to apply these skills to his/her daily life. 3. Reduce overall frequency, intensity, and duration of the anxiety so that daily functioning is not impaired. 4. Resolve the core conflict that is the source of anxiety. 5. Stabilize anxiety level while increasing ability to function on a daily basis. Diagnosis :   F43.22 Conditions For Discharge Achievement of treatment goals and objectives.  Elijah Michaelis, LCSW

## 2022-03-14 ENCOUNTER — Ambulatory Visit (INDEPENDENT_AMBULATORY_CARE_PROVIDER_SITE_OTHER): Payer: Medicare Other | Admitting: Psychology

## 2022-03-14 DIAGNOSIS — F4322 Adjustment disorder with anxiety: Secondary | ICD-10-CM

## 2022-03-14 NOTE — Progress Notes (Signed)
[facility] Counselor/Therapist Progress Note  Patient ID: NATALYN SZYMANOWSKI, MRN: [id number],    Date: 03/14/2022  Time Spent: 10:00am-10:55am   55 minutes   Treatment Type: Individual Therapy  Reported Symptoms: stress  Mental Status Exam: Appearance:  Casual and Neat     Behavior: Appropriate  Motor: Normal  Speech/Language:  Normal Rate  Affect: Appropriate  Mood: normal  Thought process: normal  Thought content:   WNL  Sensory/Perceptual disturbances:   WNL  Orientation: oriented to person, place, time/date, and situation  Attention: Good  Concentration: Good  Memory: WNL  Fund of knowledge:  Good  Insight:   Good  Judgment:  Good  Impulse Control: Good   Risk Assessment: Danger to Self:  No Self-injurious Behavior: No Danger to Others: No Duty to Warn:no Physical Aggression / Violence:No  Access to Firearms a concern: No  Gang Involvement:No   Subjective:  Pt present for face-to-face individual therapy via video Webex.  Pt consents to telehealth video session due to COVID 19 pandemic. Location of pt: home Location of therapist: home office.   Pt talked about being so busy.  She is doing a lot of charity activities for the holidays.  She has also taken care of her grandchildren.   [name] brother Juanda Crumble fell and had to sleep on the floor all night.   Pt had to take him to the doctor.   [name] also tested positive for COVID.  Pt was exposed.  Juanda Crumble was hospitalized.     [name] grandson got the flu and pt was exposed to that as well.  Pt did not get sick yet.   Pt is feeling the stress of the holidays.   She has not gotten everything done for Christmas that she wanted to.  She has so much more to do bc she is so charitable.   Pt is also hosting a family Christmas dinner this Saturday.  Addressed pt's stress and worked on Child psychotherapist.   Pt talked about caregiving for her brother Juanda Crumble.  He can be very demanding which is frustrating for pt.   Worked  on healthy boundary setting.   Worked on self care strategies.    Provided supportive therapy.   Interventions: Cognitive Behavioral Therapy and Insight-Oriented  Diagnosis: F43.22  Plan of Care: Recommend ongoing therapy.   Plan to meet monthly.  Pt is progressing toward treatment goals.    Treatment Plan (Treatment Plan Target Date:  06/29/2022)  Client Abilities/Strengths  Pt is bright, engaging and motivated for therapy.  Client Treatment Preferences  Individual therapy.  Client Statement of Needs  Improve coping skills.  Symptoms  Autonomic hyperactivity (e.g., palpitations, shortness of breath, dry mouth, trouble swallowing, nausea, diarrhea). Excessive and/or unrealistic worry that is difficult to control occurring more days than not for at least 6 months about a number of events or activities. Hypervigilance (e.g., feeling constantly on edge, experiencing concentration difficulties, having trouble falling or staying asleep, exhibiting a general state of irritability). Motor tension (e.g., restlessness, tiredness, shakiness, muscle tension). Problems Addressed  Anxiety Goals 1. Enhance ability to effectively cope with the full variety of life's worries and anxieties. 2. Learn and implement coping skills that result in a reduction of anxiety and worry, and improved daily functioning. Objective Learn to accept limitations in life and commit to tolerating, rather than avoiding, unpleasant emotions while accomplishing meaningful goals. Target Date: 2022-06-29  Frequency:  Monthly Progress: 40 Modality: individual Related Interventions 1. Use techniques from Acceptance and Commitment  Therapy to help client accept uncomfortable realities such as lack of complete control, imperfections, and uncertainty and tolerate unpleasant emotions and thoughts in order to accomplish value-consistent goals. Objective Learn and implement problem-solving strategies for realistically addressing  worries. Target Date: 2022-06-29  Frequency:  Monthly Progress: 40 Modality: individual Related Interventions 1. Assign the client a homework exercise in which he/she problem-solves a current problem.  review, reinforce success, and provide corrective feedback toward improvement. 2. Teach the client problem-solving strategies involving specifically defining a problem, generating options for addressing it, evaluating the pros and cons of each option, selecting and implementing an optional action, and reevaluating and refining the action. Objective Learn and implement calming skills to reduce overall anxiety and manage anxiety symptoms. Target Date: 2022-06-29  Frequency: Monthly Progress: 40 Modality: individual Related Interventions 1. Assign the client to read about progressive muscle relaxation and other calming strategies in relevant books or treatment manuals (e.g., Progressive Relaxation Training by Gwynneth Aliment and Dani Gobble; Mastery of Your Anxiety and Worry: Workbook by Beckie Busing). 2. Assign the client homework each session in which he/she practices relaxation exercises daily, gradually applying them progressively from non-anxiety-provoking to anxiety-provoking situations; review and reinforce success while providing corrective feedback toward improvement. 3. Teach the client calming/relaxation skills (e.g., applied relaxation, progressive muscle relaxation, cue controlled relaxation; mindful breathing; biofeedback) and how to discriminate better between relaxation and tension; teach the client how to apply these skills to his/her daily life. 3. Reduce overall frequency, intensity, and duration of the anxiety so that daily functioning is not impaired. 4. Resolve the core conflict that is the source of anxiety. 5. Stabilize anxiety level while increasing ability to function on a daily basis. Diagnosis :   F43.22 Conditions For Discharge Achievement of treatment goals and  objectives.  Tabrina Esty, LCSW

## 2022-04-11 ENCOUNTER — Ambulatory Visit (INDEPENDENT_AMBULATORY_CARE_PROVIDER_SITE_OTHER): Payer: Medicare Other | Admitting: Psychology

## 2022-04-11 DIAGNOSIS — F4322 Adjustment disorder with anxiety: Secondary | ICD-10-CM | POA: Diagnosis not present

## 2022-04-11 NOTE — Progress Notes (Signed)
[facility] Counselor/Therapist Progress Note  Patient ID: Virginia Brooks, MRN: [id number],    Date: 04/11/2022  Time Spent: 10:00am-10:55am   55 minutes   Treatment Type: Individual Therapy  Reported Symptoms: stress  Mental Status Exam: Appearance:  Casual and Neat     Behavior: Appropriate  Motor: Normal  Speech/Language:  Normal Rate  Affect: Appropriate  Mood: normal  Thought process: normal  Thought content:   WNL  Sensory/Perceptual disturbances:   WNL  Orientation: oriented to person, place, time/date, and situation  Attention: Good  Concentration: Good  Memory: WNL  Fund of knowledge:  Good  Insight:   Good  Judgment:  Good  Impulse Control: Good   Risk Assessment: Danger to Self:  No Self-injurious Behavior: No Danger to Others: No Duty to Warn:no Physical Aggression / Violence:No  Access to Firearms a concern: No  Gang Involvement:No   Subjective:  Pt present for face-to-face individual therapy via video Webex.  Pt consents to telehealth video session due to COVID 19 pandemic. Location of pt: home Location of therapist: home office.   Pt talked about the holidays.   She states they were very busy.  She traveled to [place] after Christmas with her family.   At one point she got lost which was upsetting.  [name] family helped her so she was safe.  Addressed pt's stress and worked on Child psychotherapist.   Pt talked about caregiving for her brother Juanda Crumble.  He can be very demanding which is frustrating for pt.   Worked on healthy boundary setting.   Pt talked about her grandchildren getting older and not needing her as much.  It is sad to pt that they don't need her as much.   Worked on self care strategies.    Provided supportive therapy.   Interventions: Cognitive Behavioral Therapy and Insight-Oriented  Diagnosis: F43.22  Plan of Care: Recommend ongoing therapy.   Plan to meet monthly.  Pt is progressing toward treatment goals.     Treatment Plan (Treatment Plan Target Date:  06/29/2022)  Client Abilities/Strengths  Pt is bright, engaging and motivated for therapy.  Client Treatment Preferences  Individual therapy.  Client Statement of Needs  Improve coping skills.  Symptoms  Autonomic hyperactivity (e.g., palpitations, shortness of breath, dry mouth, trouble swallowing, nausea, diarrhea). Excessive and/or unrealistic worry that is difficult to control occurring more days than not for at least 6 months about a number of events or activities. Hypervigilance (e.g., feeling constantly on edge, experiencing concentration difficulties, having trouble falling or staying asleep, exhibiting a general state of irritability). Motor tension (e.g., restlessness, tiredness, shakiness, muscle tension). Problems Addressed  Anxiety Goals 1. Enhance ability to effectively cope with the full variety of life's worries and anxieties. 2. Learn and implement coping skills that result in a reduction of anxiety and worry, and improved daily functioning. Objective Learn to accept limitations in life and commit to tolerating, rather than avoiding, unpleasant emotions while accomplishing meaningful goals. Target Date: 2022-06-29  Frequency:  Monthly Progress: 40 Modality: individual Related Interventions 1. Use techniques from Acceptance and Commitment Therapy to help client accept uncomfortable realities such as lack of complete control, imperfections, and uncertainty and tolerate unpleasant emotions and thoughts in order to accomplish value-consistent goals. Objective Learn and implement problem-solving strategies for realistically addressing worries. Target Date: 2022-06-29  Frequency:  Monthly Progress: 40 Modality: individual Related Interventions 1. Assign the client a homework exercise in which he/she problem-solves a current problem.  review, reinforce success,  and provide corrective feedback toward improvement. 2. Teach the client  problem-solving strategies involving specifically defining a problem, generating options for addressing it, evaluating the pros and cons of each option, selecting and implementing an optional action, and reevaluating and refining the action. Objective Learn and implement calming skills to reduce overall anxiety and manage anxiety symptoms. Target Date: 2022-06-29  Frequency: Monthly Progress: 40 Modality: individual Related Interventions 1. Assign the client to read about progressive muscle relaxation and other calming strategies in relevant books or treatment manuals (e.g., Progressive Relaxation Training by Gwynneth Aliment and Dani Gobble; Mastery of Your Anxiety and Worry: Workbook by Beckie Busing). 2. Assign the client homework each session in which he/she practices relaxation exercises daily, gradually applying them progressively from non-anxiety-provoking to anxiety-provoking situations; review and reinforce success while providing corrective feedback toward improvement. 3. Teach the client calming/relaxation skills (e.g., applied relaxation, progressive muscle relaxation, cue controlled relaxation; mindful breathing; biofeedback) and how to discriminate better between relaxation and tension; teach the client how to apply these skills to his/her daily life. 3. Reduce overall frequency, intensity, and duration of the anxiety so that daily functioning is not impaired. 4. Resolve the core conflict that is the source of anxiety. 5. Stabilize anxiety level while increasing ability to function on a daily basis. Diagnosis :   F43.22 Conditions For Discharge Achievement of treatment goals and objectives.  Imran Nuon, LCSW

## 2022-05-08 ENCOUNTER — Ambulatory Visit: Payer: Medicare Other | Admitting: Psychology

## 2022-05-08 DIAGNOSIS — F4322 Adjustment disorder with anxiety: Secondary | ICD-10-CM

## 2022-05-08 NOTE — Progress Notes (Signed)
[facility] Counselor/Therapist Progress Note  Patient ID: Virginia Brooks, MRN: [id number],    Date: 05/08/2022  Time Spent: 10:00am-10:55am   55 minutes   Treatment Type: Individual Therapy  Reported Symptoms: stress  Mental Status Exam: Appearance:  Casual and Neat     Behavior: Appropriate  Motor: Normal  Speech/Language:  Normal Rate  Affect: Appropriate  Mood: normal  Thought process: normal  Thought content:   WNL  Sensory/Perceptual disturbances:   WNL  Orientation: oriented to person, place, time/date, and situation  Attention: Good  Concentration: Good  Memory: WNL  Fund of knowledge:  Good  Insight:   Good  Judgment:  Good  Impulse Control: Good   Risk Assessment: Danger to Self:  No Self-injurious Behavior: No Danger to Others: No Duty to Warn:no Physical Aggression / Violence:No  Access to Firearms a concern: No  Gang Involvement:No   Subjective:  Pt present for face-to-face individual therapy via video Webex.  Pt consents to telehealth video session due to COVID 19 pandemic. Location of pt: home Location of therapist: home office.   Pt talked about the work on her house she needs to get done.  It is stressful to her to have so much that needs to be done. Pt is preparing for her [place] missions trip that is in June.  There is a lot of preparation to do.   Pt talked about her brother Juanda Crumble.   He fell in December and he has had many doctors' appointments that pt has had to take him to.  Pt is taking Juanda Crumble to the beach on February 22nd to celebrate Juanda Crumble' birthday.  Pt states this is the last time she is going to do this trip since it can be stressful.  Addressed the challenges of caregiving for [place]. Worked on how pt can let go of trying to fix things that are beyond her control.  Worked on Presenter, broadcasting.   Worked on self care strategies.    Provided supportive therapy.   Interventions: Cognitive Behavioral Therapy and  Insight-Oriented  Diagnosis: F43.22  Plan of Care: Recommend ongoing therapy.   Plan to meet monthly.  Pt is progressing toward treatment goals.    Treatment Plan (Treatment Plan Target Date:  06/29/2022)  Client Abilities/Strengths  Pt is bright, engaging and motivated for therapy.  Client Treatment Preferences  Individual therapy.  Client Statement of Needs  Improve coping skills.  Symptoms  Autonomic hyperactivity (e.g., palpitations, shortness of breath, dry mouth, trouble swallowing, nausea, diarrhea). Excessive and/or unrealistic worry that is difficult to control occurring more days than not for at least 6 months about a number of events or activities. Hypervigilance (e.g., feeling constantly on edge, experiencing concentration difficulties, having trouble falling or staying asleep, exhibiting a general state of irritability). Motor tension (e.g., restlessness, tiredness, shakiness, muscle tension). Problems Addressed  Anxiety Goals 1. Enhance ability to effectively cope with the full variety of life's worries and anxieties. 2. Learn and implement coping skills that result in a reduction of anxiety and worry, and improved daily functioning. Objective Learn to accept limitations in life and commit to tolerating, rather than avoiding, unpleasant emotions while accomplishing meaningful goals. Target Date: 2022-06-29  Frequency:  Monthly Progress: 40 Modality: individual Related Interventions 1. Use techniques from Acceptance and Commitment Therapy to help client accept uncomfortable realities such as lack of complete control, imperfections, and uncertainty and tolerate unpleasant emotions and thoughts in order to accomplish value-consistent goals. Objective Learn and implement problem-solving strategies for realistically  addressing worries. Target Date: 2022-06-29  Frequency:  Monthly Progress: 40 Modality: individual Related Interventions 1. Assign the client a homework exercise  in which he/she problem-solves a current problem.  review, reinforce success, and provide corrective feedback toward improvement. 2. Teach the client problem-solving strategies involving specifically defining a problem, generating options for addressing it, evaluating the pros and cons of each option, selecting and implementing an optional action, and reevaluating and refining the action. Objective Learn and implement calming skills to reduce overall anxiety and manage anxiety symptoms. Target Date: 2022-06-29  Frequency: Monthly Progress: 40 Modality: individual Related Interventions 1. Assign the client to read about progressive muscle relaxation and other calming strategies in relevant books or treatment manuals (e.g., Progressive Relaxation Training by Gwynneth Aliment and Dani Gobble; Mastery of Your Anxiety and Worry: Workbook by Beckie Busing). 2. Assign the client homework each session in which he/she practices relaxation exercises daily, gradually applying them progressively from non-anxiety-provoking to anxiety-provoking situations; review and reinforce success while providing corrective feedback toward improvement. 3. Teach the client calming/relaxation skills (e.g., applied relaxation, progressive muscle relaxation, cue controlled relaxation; mindful breathing; biofeedback) and how to discriminate better between relaxation and tension; teach the client how to apply these skills to his/her daily life. 3. Reduce overall frequency, intensity, and duration of the anxiety so that daily functioning is not impaired. 4. Resolve the core conflict that is the source of anxiety. 5. Stabilize anxiety level while increasing ability to function on a daily basis. Diagnosis :   F43.22 Conditions For Discharge Achievement of treatment goals and objectives.  Renly Roots, LCSW

## 2022-06-05 ENCOUNTER — Ambulatory Visit: Payer: Medicare Other | Admitting: Psychology

## 2022-06-05 DIAGNOSIS — F4322 Adjustment disorder with anxiety: Secondary | ICD-10-CM | POA: Diagnosis not present

## 2022-06-05 NOTE — Progress Notes (Signed)
[facility] Counselor/Therapist Progress Note  Patient ID: Virginia Brooks, MRN: [id number],    Date: 06/05/2022  Time Spent: 10:00am-10:55am   55 minutes   Treatment Type: Individual Therapy  Reported Symptoms: stress  Mental Status Exam: Appearance:  Casual and Neat     Behavior: Appropriate  Motor: Normal  Speech/Language:  Normal Rate  Affect: Appropriate  Mood: normal  Thought process: normal  Thought content:   WNL  Sensory/Perceptual disturbances:   WNL  Orientation: oriented to person, place, time/date, and situation  Attention: Good  Concentration: Good  Memory: WNL  Fund of knowledge:  Good  Insight:   Good  Judgment:  Good  Impulse Control: Good   Risk Assessment: Danger to Self:  No Self-injurious Behavior: No Danger to Others: No Duty to Warn:no Physical Aggression / Violence:No  Access to Firearms a concern: No  Gang Involvement:No   Subjective:  Pt present for face-to-face individual therapy via video Webex.  Pt consents to telehealth video session due to COVID 19 pandemic. Location of pt: home Location of therapist: home office.   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  She is getting attached to the kids.   Pt talked about her brother Juanda Crumble.   Pt took Juanda Crumble to [place] for his birthday.  The trip went fairly well.  Addressed the challenges of caregiving for [place]. Worked on how pt can let go of trying to fix things that are beyond her control.  Worked on Presenter, broadcasting.   Pt talked about her daughter not needing her as much to help with the kids bc they are older and more independent.  Pt misses them at times.  Helped pt process her feelings.  Worked on self care strategies.    Provided supportive therapy.   Interventions: Cognitive Behavioral Therapy and Insight-Oriented  Diagnosis: F43.22  Plan of Care: Recommend ongoing therapy.   Plan to meet monthly.  Pt is progressing toward treatment goals.     Treatment Plan (Treatment Plan Target Date:  06/29/2022)  Client Abilities/Strengths  Pt is bright, engaging and motivated for therapy.  Client Treatment Preferences  Individual therapy.  Client Statement of Needs  Improve coping skills.  Symptoms  Autonomic hyperactivity (e.g., palpitations, shortness of breath, dry mouth, trouble swallowing, nausea, diarrhea). Excessive and/or unrealistic worry that is difficult to control occurring more days than not for at least 6 months about a number of events or activities. Hypervigilance (e.g., feeling constantly on edge, experiencing concentration difficulties, having trouble falling or staying asleep, exhibiting a general state of irritability). Motor tension (e.g., restlessness, tiredness, shakiness, muscle tension). Problems Addressed  Anxiety Goals 1. Enhance ability to effectively cope with the full variety of life's worries and anxieties. 2. Learn and implement coping skills that result in a reduction of anxiety and worry, and improved daily functioning. Objective Learn to accept limitations in life and commit to tolerating, rather than avoiding, unpleasant emotions while accomplishing meaningful goals. Target Date: 2022-06-29  Frequency:  Monthly Progress: 40 Modality: individual Related Interventions 1. Use techniques from Acceptance and Commitment Therapy to help client accept uncomfortable realities such as lack of complete control, imperfections, and uncertainty and tolerate unpleasant emotions and thoughts in order to accomplish value-consistent goals. Objective Learn and implement problem-solving strategies for realistically addressing worries. Target Date: 2022-06-29  Frequency:  Monthly Progress: 40 Modality: individual Related Interventions 1. Assign the client a homework exercise in which he/she problem-solves a current problem.  review, reinforce  success, and provide corrective feedback toward improvement. 2. Teach the client  problem-solving strategies involving specifically defining a problem, generating options for addressing it, evaluating the pros and cons of each option, selecting and implementing an optional action, and reevaluating and refining the action. Objective Learn and implement calming skills to reduce overall anxiety and manage anxiety symptoms. Target Date: 2022-06-29  Frequency: Monthly Progress: 40 Modality: individual Related Interventions 1. Assign the client to read about progressive muscle relaxation and other calming strategies in relevant books or treatment manuals (e.g., Progressive Relaxation Training by Gwynneth Aliment and Dani Gobble; Mastery of Your Anxiety and Worry: Workbook by Beckie Busing). 2. Assign the client homework each session in which he/she practices relaxation exercises daily, gradually applying them progressively from non-anxiety-provoking to anxiety-provoking situations; review and reinforce success while providing corrective feedback toward improvement. 3. Teach the client calming/relaxation skills (e.g., applied relaxation, progressive muscle relaxation, cue controlled relaxation; mindful breathing; biofeedback) and how to discriminate better between relaxation and tension; teach the client how to apply these skills to his/her daily life. 3. Reduce overall frequency, intensity, and duration of the anxiety so that daily functioning is not impaired. 4. Resolve the core conflict that is the source of anxiety. 5. Stabilize anxiety level while increasing ability to function on a daily basis. Diagnosis :   F43.22 Conditions For Discharge Achievement of treatment goals and objectives.  Benjiman Sedgwick, LCSW

## 2022-07-04 ENCOUNTER — Ambulatory Visit: Payer: Medicare Other | Admitting: Psychology

## 2022-07-04 DIAGNOSIS — F4322 Adjustment disorder with anxiety: Secondary | ICD-10-CM

## 2022-07-04 NOTE — Progress Notes (Signed)
[facility] Initial Adult Exam  Name: Virginia Brooks Date: 07/04/2022 MRN: [id number] DOB: December 07, 1950 PCP: Sharilyn Sites, MD  Time spent: 10:00am-10:55am  55 minutes  Guardian/Payee:  Crista Curb requested: No   Reason for Visit /Presenting Problem: Pt present for face-to-face initial assessment update via video Webex.  Pt consents to telehealth video session due to COVID 19 pandemic. Location of pt: home Location of therapist: home office.  Pt has anxiety at times regarding family dynamics.   Pt has challenges care giving for her brother Juanda Crumble.  She states that she does not have any place to talk about her true feelings and feels that therapy is a very helpful place to talk.  Reviewed pt's treatment plan for annual update.  Updated pt's treatment plan and IA.  Pt participated in setting treatment goals.   Pt wants to continue to have a safe place to talk and to improve coping skills.   Plan to meet monthly.    Mental Status Exam: Appearance:   Casual and Neat     Behavior:  Appropriate  Motor:  Normal  Speech/Language:   Normal Rate  Affect:  Appropriate  Mood:  normal  Thought process:  normal  Thought content:    WNL  Sensory/Perceptual disturbances:    WNL  Orientation:  oriented to person, place, time/date, and situation  Attention:  Good  Concentration:  Good  Memory:  WNL  Fund of knowledge:   Good  Insight:    Good  Judgment:   Good  Impulse Control:  Good    Reported Symptoms:  stress  Risk Assessment: Danger to Self:  No Self-injurious Behavior: No Danger to Others: No Duty to Warn:no Physical Aggression / Violence:No  Access to Firearms a concern: No  Gang Involvement:No  Patient / guardian was educated about steps to take if suicide or homicide risk level increases between visits: n/a While future psychiatric events cannot be accurately predicted, the patient does not currently require acute inpatient psychiatric care and does  not currently meet [facility] involuntary commitment criteria.  Substance Abuse History: Current substance abuse: No     Past Psychiatric History:   No previous psychological problems have been observed Outpatient Providers:n/a History of Psych Hospitalization: No  Psychological Testing:  n/a    Abuse History:  Victim of: No.,  n/a    Report needed: No. Victim of Neglect:No. Perpetrator of  n/a   Witness / Exposure to Domestic Violence: No   Protective Services Involvement: No  Witness to [employer] Violence:  No   Family History:  Family History  Problem Relation Age of Onset   COPD Mother    Angina Mother    Stroke Father    Heart attack Father    Colon cancer Sister 32       colon ca   Cancer Sister    Cancer Brother        prostate/bladder/kidney ca   Colon cancer Other        79     Living situation: the patient lives alone.  Pt grew up with both parents and 3 brothers and 1 sister.  Father had a stroke and was cared for at home for 5 years.  A year and a half later pt's mother and sister were killed crossing the street.  [name] mother was 29 yrs old.  Pt lost both of her parents when she was 43.  No family history of mental illness , substance  abuse or abuse.   [name] sister is immobile.  Sister's daughter was shot and killed in her own home a few years ago in a home invasion.    Sexual Orientation: Straight  Relationship Status: widowed  Name of spouse / other:[name] If a parent, number of children / ages:2 adult children Pt has been widowed for 19 yrs.  Pt has 2 kids and 4 grandchildren.   [name] husband died of esophageal cancer.  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   Pt believes that her husband is in [place] so her faith helps her with the loss.   Pt tells her grandchildren about her husband Doren Custard so she can keep his memory alive.   Pt still wears her wedding ring.  Pt does not want another relationship.  She feels like she has a full life and enjoys not having  to be accountable to a man.   Pt has a daughter, son and a granddaughter and 3 grandsons.   Pt is very involved with family.  Support Systems: lives alone  Financial Stress:  No   Income/Employment/Disability: Actor: No   Educational History: Education: Scientist, product/process development: Protestant  Any cultural differences that may affect / interfere with treatment:  not applicable   Recreation/Hobbies: Pt enjoys spending time with family and volunteering at church.   Stressors: Marital or family conflict    Strengths: Supportive Relationships, Family, Church, Spirituality, Hopefulness, Conservator, museum/gallery, and Able to Communicate Effectively  Barriers:  none   Legal History: Pending legal issue / charges: The patient has no significant history of legal issues. History of legal issue / charges:  n/a  Medical History/Surgical History: reviewed Past Medical History:  Diagnosis Date   Endometrial hyperplasia without atypia, complex 04/24/2004   on hysteroscopic resection of polyps   History of COVID-19 09/27/2020   Hypercholesterolemia    Hypertension    Keratosis    Malignant melanoma of left forearm ([place])    Shingles 11/2015   Spondylosis of cervical joint     Past Surgical History:  Procedure Laterality Date   BREAST SURGERY  Dec 2000   excision papilloma   DILATION AND CURETTAGE OF UTERUS     HYSTEROSCOPY WITH RESECTOSCOPE  04/24/2004   MINOR BREAST BIOPSY Right 1994    Medications: Current Outpatient Medications  Medication Sig Dispense Refill   amLODipine (NORVASC) 5 MG tablet Take 1 tablet (5 mg total) by mouth daily. 30 tablet 2   ASPIRIN LOW DOSE 81 MG EC tablet Take 81 mg by mouth daily.     cetirizine (ZYRTEC) 10 MG tablet Take 10 mg by mouth daily. Takes 1/2 tablet daily     cholecalciferol (VITAMIN D) 1000 UNITS tablet Take 1,000 Units by mouth daily.     Coenzyme Q10 (COQ10) 200 MG CAPS Take 1  tablet by mouth daily.     fish oil-omega-3 fatty acids 1000 MG capsule Take 2 g by mouth daily.     ketotifen (ZADITOR) 0.025 % ophthalmic solution 1 drop 2 (two) times daily.     MULTIPLE VITAMINS PO Take 1 tablet by mouth daily.     rosuvastatin (CRESTOR) 5 MG tablet Take 1 tablet (5 mg total) by mouth daily. 90 tablet 3   No current facility-administered medications for this visit.    No Known Allergies  Diagnoses:  F43.22  Plan of Care: Recommend ongoing therapy.   Plan to meet monthly.  Pt is progressing toward treatment goals.  Treatment Plan (Treatment Plan Target Date:  07/04/2023)  Client Abilities/Strengths  Pt is bright, engaging and motivated for therapy.  Client Treatment Preferences  Individual therapy.  Client Statement of Needs  Improve coping skills.  Symptoms  Autonomic hyperactivity (e.g., palpitations, shortness of breath, dry mouth, trouble swallowing, nausea, diarrhea). Excessive and/or unrealistic worry that is difficult to control occurring more days than not for at least 6 months about a number of events or activities. Hypervigilance (e.g., feeling constantly on edge, experiencing concentration difficulties, having trouble falling or staying asleep, exhibiting a general state of irritability). Motor tension (e.g., restlessness, tiredness, shakiness, muscle tension). Problems Addressed  Anxiety Goals 1. Enhance ability to effectively cope with the full variety of life's worries and anxieties. 2. Learn and implement coping skills that result in a reduction of anxiety and worry, and improved daily functioning. Objective Learn to accept limitations in life and commit to tolerating, rather than avoiding, unpleasant emotions while accomplishing meaningful goals. Target Date: 2023-07-04 Frequency:  Monthly Progress: 50 Modality: individual Related Interventions 1. Use techniques from Acceptance and Commitment Therapy to help client accept uncomfortable  realities such as lack of complete control, imperfections, and uncertainty and tolerate unpleasant emotions and thoughts in order to accomplish value-consistent goals. Objective Learn and implement problem-solving strategies for realistically addressing worries. Target Date: 2023-07-04  Frequency:  Monthly Progress: 50 Modality: individual Related Interventions 1. Assign the client a homework exercise in which he/she problem-solves a current problem.  review, reinforce success, and provide corrective feedback toward improvement. 2. Teach the client problem-solving strategies involving specifically defining a problem, generating options for addressing it, evaluating the pros and cons of each option, selecting and implementing an optional action, and reevaluating and refining the action. Objective Learn and implement calming skills to reduce overall anxiety and manage anxiety symptoms. Target Date: 2023-07-04  Frequency: Monthly Progress: 50 Modality: individual Related Interventions 1. Assign the client to read about progressive muscle relaxation and other calming strategies in relevant books or treatment manuals (e.g., Progressive Relaxation Training by Gwynneth Aliment and Dani Gobble; Mastery of Your Anxiety and Worry: Workbook by Beckie Busing). 2. Assign the client homework each session in which he/she practices relaxation exercises daily, gradually applying them progressively from non-anxiety-provoking to anxiety-provoking situations; review and reinforce success while providing corrective feedback toward improvement. 3. Teach the client calming/relaxation skills (e.g., applied relaxation, progressive muscle relaxation, cue controlled relaxation; mindful breathing; biofeedback) and how to discriminate better between relaxation and tension; teach the client how to apply these skills to his/her daily life. 3. Reduce overall frequency, intensity, and duration of the anxiety so that daily functioning is  not impaired. 4. Resolve the core conflict that is the source of anxiety. 5. Stabilize anxiety level while increasing ability to function on a daily basis. Diagnosis :   F43.22 Conditions For Discharge Achievement of treatment goals and objectives.    Elesa Garman, LCSW

## 2022-08-01 ENCOUNTER — Ambulatory Visit: Payer: Medicare Other | Admitting: Psychology

## 2022-08-01 DIAGNOSIS — F4322 Adjustment disorder with anxiety: Secondary | ICD-10-CM | POA: Diagnosis not present

## 2022-08-01 NOTE — Progress Notes (Signed)
[facility] Counselor/Therapist Progress Note  Patient ID: Virginia Brooks, MRN: [id number],    Date: 08/01/2022  Time Spent: 10:00am-10:50am   50 minutes   Treatment Type: Individual Therapy  Reported Symptoms: stress  Mental Status Exam: Appearance:  Casual     Behavior: Appropriate  Motor: Normal  Speech/Language:  Normal Rate  Affect: Appropriate  Mood: normal  Thought process: normal  Thought content:   WNL  Sensory/Perceptual disturbances:   WNL  Orientation: oriented to person, place, time/date, and situation  Attention: Good  Concentration: Good  Memory: WNL  Fund of knowledge:  Good  Insight:   Good  Judgment:  Good  Impulse Control: Good   Risk Assessment: Danger to Self:  No Self-injurious Behavior: No Danger to Others: No Duty to Warn:no Physical Aggression / Violence:No  Access to Firearms a concern: No  Gang Involvement:No   Subjective: Pt present for face-to-face individual therapy via video.  Pt consents to telehealth video session due to COVID 19 pandemic. Location of pt: home Location of therapist: home office.   Pt talked about the recent death of her aunt.   Helped pt process her feelings and grief. Pt talked about her [name].   Caregiving for him can be very challenging.   Addressed pt's frustrations.  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  There is a lot to do to prepare for the trip.    Worked on self care strategies. Provided supportive therapy.    Interventions: Cognitive Behavioral Therapy and Insight-Oriented  Diagnosis: F43.22  Plan of Care: Recommend ongoing therapy.   Plan to meet monthly.  Pt is progressing toward treatment goals.    Treatment Plan (Treatment Plan Target Date:  07/04/2023)  Client Abilities/Strengths  Pt is bright, engaging and motivated for therapy.  Client Treatment Preferences  Individual therapy.  Client Statement of Needs  Improve coping skills.  Symptoms   Autonomic hyperactivity (e.g., palpitations, shortness of breath, dry mouth, trouble swallowing, nausea, diarrhea). Excessive and/or unrealistic worry that is difficult to control occurring more days than not for at least 6 months about a number of events or activities. Hypervigilance (e.g., feeling constantly on edge, experiencing concentration difficulties, having trouble falling or staying asleep, exhibiting a general state of irritability). Motor tension (e.g., restlessness, tiredness, shakiness, muscle tension). Problems Addressed  Anxiety Goals 1. Enhance ability to effectively cope with the full variety of life's worries and anxieties. 2. Learn and implement coping skills that result in a reduction of anxiety and worry, and improved daily functioning. Objective Learn to accept limitations in life and commit to tolerating, rather than avoiding, unpleasant emotions while accomplishing meaningful goals. Target Date: 2023-07-04 Frequency:  Monthly Progress: 50 Modality: individual Related Interventions 1. Use techniques from Acceptance and Commitment Therapy to help client accept uncomfortable realities such as lack of complete control, imperfections, and uncertainty and tolerate unpleasant emotions and thoughts in order to accomplish value-consistent goals. Objective Learn and implement problem-solving strategies for realistically addressing worries. Target Date: 2023-07-04  Frequency:  Monthly Progress: 50 Modality: individual Related Interventions 1. Assign the client a homework exercise in which he/she problem-solves a current problem.  review, reinforce success, and provide corrective feedback toward improvement. 2. Teach the client problem-solving strategies involving specifically defining a problem, generating options for addressing it, evaluating the pros and cons of each option, selecting and implementing an optional action, and reevaluating and refining the action. Objective Learn  and implement calming skills to reduce overall anxiety  and manage anxiety symptoms. Target Date: 2023-07-04  Frequency: Monthly Progress: 50 Modality: individual Related Interventions 1. Assign the client to read about progressive muscle relaxation and other calming strategies in relevant books or treatment manuals (e.g., Progressive Relaxation Training by Robb Matar and Alen Blew; Mastery of Your Anxiety and Worry: Workbook by Earlie Counts). 2. Assign the client homework each session in which he/she practices relaxation exercises daily, gradually applying them progressively from non-anxiety-provoking to anxiety-provoking situations; review and reinforce success while providing corrective feedback toward improvement. 3. Teach the client calming/relaxation skills (e.g., applied relaxation, progressive muscle relaxation, cue controlled relaxation; mindful breathing; biofeedback) and how to discriminate better between relaxation and tension; teach the client how to apply these skills to his/her daily life. 3. Reduce overall frequency, intensity, and duration of the anxiety so that daily functioning is not impaired. 4. Resolve the core conflict that is the source of anxiety. 5. Stabilize anxiety level while increasing ability to function on a daily basis. Diagnosis :   F43.22 Conditions For Discharge Achievement of treatment goals and objectives.  Staci Carver, LCSW

## 2022-08-29 ENCOUNTER — Ambulatory Visit (INDEPENDENT_AMBULATORY_CARE_PROVIDER_SITE_OTHER): Payer: Medicare Other | Admitting: Psychology

## 2022-08-29 DIAGNOSIS — F4322 Adjustment disorder with anxiety: Secondary | ICD-10-CM | POA: Diagnosis not present

## 2022-08-29 NOTE — Progress Notes (Signed)
[facility] Counselor/Therapist Progress Note  Patient ID: DESI FRED, MRN: [id number],    Date: 08/29/2022  Time Spent: 10:00am-10:55am   55 minutes   Treatment Type: Individual Therapy  Reported Symptoms: stress  Mental Status Exam: Appearance:  Casual     Behavior: Appropriate  Motor: Normal  Speech/Language:  Normal Rate  Affect: Appropriate  Mood: normal  Thought process: normal  Thought content:   WNL  Sensory/Perceptual disturbances:   WNL  Orientation: oriented to person, place, time/date, and situation  Attention: Good  Concentration: Good  Memory: WNL  Fund of knowledge:  Good  Insight:   Good  Judgment:  Good  Impulse Control: Good   Risk Assessment: Danger to Self:  No Self-injurious Behavior: No Danger to Others: No Duty to Warn:no Physical Aggression / Violence:No  Access to Firearms a concern: No  Gang Involvement:No   Subjective: Pt present for face-to-face individual therapy via video.  Pt consents to telehealth video session due to COVID 19 pandemic. Location of pt: home Location of therapist: home office.   Pt talked about her [name].   Caregiving for him can be very challenging.   Addressed pt's frustrations.  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    She leaves in a week and has a lot to do to prepare for her trip.   Addressed the stress of all the preparations.   Worked on Optician, dispensing.  Pt talked about her 62 yo grandson getting injured in his eye.   They had to go to the doctors and he had to be seen every day for a few days.  Addressed pt's worries about her grandson.  Worked on self care strategies. Provided supportive therapy.    Interventions: Cognitive Behavioral Therapy and Insight-Oriented  Diagnosis: F43.22  Plan of Care: Recommend ongoing therapy.   Plan to meet monthly.  Pt is progressing toward treatment goals.    Treatment Plan (Treatment Plan Target Date:   07/04/2023)  Client Abilities/Strengths  Pt is bright, engaging and motivated for therapy.  Client Treatment Preferences  Individual therapy.  Client Statement of Needs  Improve coping skills.  Symptoms  Autonomic hyperactivity (e.g., palpitations, shortness of breath, dry mouth, trouble swallowing, nausea, diarrhea). Excessive and/or unrealistic worry that is difficult to control occurring more days than not for at least 6 months about a number of events or activities. Hypervigilance (e.g., feeling constantly on edge, experiencing concentration difficulties, having trouble falling or staying asleep, exhibiting a general state of irritability). Motor tension (e.g., restlessness, tiredness, shakiness, muscle tension). Problems Addressed  Anxiety Goals 1. Enhance ability to effectively cope with the full variety of life's worries and anxieties. 2. Learn and implement coping skills that result in a reduction of anxiety and worry, and improved daily functioning. Objective Learn to accept limitations in life and commit to tolerating, rather than avoiding, unpleasant emotions while accomplishing meaningful goals. Target Date: 2023-07-04 Frequency:  Monthly Progress: 50 Modality: individual Related Interventions 1. Use techniques from Acceptance and Commitment Therapy to help client accept uncomfortable realities such as lack of complete control, imperfections, and uncertainty and tolerate unpleasant emotions and thoughts in order to accomplish value-consistent goals. Objective Learn and implement problem-solving strategies for realistically addressing worries. Target Date: 2023-07-04  Frequency:  Monthly Progress: 50 Modality: individual Related Interventions 1. Assign the client a homework exercise in which he/she problem-solves a current problem.  review, reinforce success, and provide corrective feedback toward improvement. 2. Teach the client  problem-solving strategies involving specifically  defining a problem, generating options for addressing it, evaluating the pros and cons of each option, selecting and implementing an optional action, and reevaluating and refining the action. Objective Learn and implement calming skills to reduce overall anxiety and manage anxiety symptoms. Target Date: 2023-07-04  Frequency: Monthly Progress: 50 Modality: individual Related Interventions 1. Assign the client to read about progressive muscle relaxation and other calming strategies in relevant books or treatment manuals (e.g., Progressive Relaxation Training by Robb Matar and Alen Blew; Mastery of Your Anxiety and Worry: Workbook by Earlie Counts). 2. Assign the client homework each session in which he/she practices relaxation exercises daily, gradually applying them progressively from non-anxiety-provoking to anxiety-provoking situations; review and reinforce success while providing corrective feedback toward improvement. 3. Teach the client calming/relaxation skills (e.g., applied relaxation, progressive muscle relaxation, cue controlled relaxation; mindful breathing; biofeedback) and how to discriminate better between relaxation and tension; teach the client how to apply these skills to his/her daily life. 3. Reduce overall frequency, intensity, and duration of the anxiety so that daily functioning is not impaired. 4. Resolve the core conflict that is the source of anxiety. 5. Stabilize anxiety level while increasing ability to function on a daily basis. Diagnosis :   F43.22 Conditions For Discharge Achievement of treatment goals and objectives.  Kahlea Cobert, LCSW

## 2022-09-25 DIAGNOSIS — H2512 Age-related nuclear cataract, left eye: Secondary | ICD-10-CM | POA: Diagnosis not present

## 2022-09-25 DIAGNOSIS — H04123 Dry eye syndrome of bilateral lacrimal glands: Secondary | ICD-10-CM | POA: Diagnosis not present

## 2022-09-25 DIAGNOSIS — H25811 Combined forms of age-related cataract, right eye: Secondary | ICD-10-CM | POA: Diagnosis not present

## 2022-09-26 ENCOUNTER — Ambulatory Visit: Payer: Medicare Other | Admitting: Psychology

## 2022-09-26 DIAGNOSIS — F4322 Adjustment disorder with anxiety: Secondary | ICD-10-CM | POA: Diagnosis not present

## 2022-09-26 NOTE — Progress Notes (Signed)
[facility] Counselor/Therapist Progress Note  Patient ID: Virginia Brooks, MRN: [id number],    Date: 09/26/2022  Time Spent: 10:00am-10:55am   55 minutes   Treatment Type: Individual Therapy  Reported Symptoms: stress  Mental Status Exam: Appearance:  Casual     Behavior: Appropriate  Motor: Normal  Speech/Language:  Normal Rate  Affect: Appropriate  Mood: normal  Thought process: normal  Thought content:   WNL  Sensory/Perceptual disturbances:   WNL  Orientation: oriented to person, place, time/date, and situation  Attention: Good  Concentration: Good  Memory: WNL  Fund of knowledge:  Good  Insight:   Good  Judgment:  Good  Impulse Control: Good   Risk Assessment: Danger to Self:  No Self-injurious Behavior: No Danger to Others: No Duty to Warn:no Physical Aggression / Violence:No  Access to Firearms a concern: No  Gang Involvement:No   Subjective: Pt present for face-to-face individual therapy via video.  Pt consents to telehealth video session and is aware of limitations of virtual sessions. Location of pt: home Location of therapist: home office.   Pt talked about her missions trip to [place].   The trip went well and was a good experience.  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  There was some travel stress on the way home.  Addressed how this impacted pt.   Pt has been busy since getting back from [place].   She has had to take her [name] to several doctors' appointments.   Pt talked about a stressful day dealing with family.  Pt and her son had conflict and he told her he can't talk to her anymore bc they both have a need to be right. Pt also had stressful conversations with [name] and her sister in Social worker.  Addressed the interactions and helped pt process her feelings and relationship dynamics.  Pt realizes she has a strong need to "be right" and needs to learn to let it go.    Worked on self care strategies. Provided supportive therapy.    Interventions: Cognitive Behavioral Therapy and Insight-Oriented  Diagnosis: F43.22  Plan of Care: Recommend ongoing therapy.   Plan to meet monthly.  Pt is progressing toward treatment goals.    Treatment Plan (Treatment Plan Target Date:  07/04/2023)  Client Abilities/Strengths  Pt is bright, engaging and motivated for therapy.  Client Treatment Preferences  Individual therapy.  Client Statement of Needs  Improve coping skills.  Symptoms  Autonomic hyperactivity (e.g., palpitations, shortness of breath, dry mouth, trouble swallowing, nausea, diarrhea). Excessive and/or unrealistic worry that is difficult to control occurring more days than not for at least 6 months about a number of events or activities. Hypervigilance (e.g., feeling constantly on edge, experiencing concentration difficulties, having trouble falling or staying asleep, exhibiting a general state of irritability). Motor tension (e.g., restlessness, tiredness, shakiness, muscle tension). Problems Addressed  Anxiety Goals 1. Enhance ability to effectively cope with the full variety of life's worries and anxieties. 2. Learn and implement coping skills that result in a reduction of anxiety and worry, and improved daily functioning. Objective Learn to accept limitations in life and commit to tolerating, rather than avoiding, unpleasant emotions while accomplishing meaningful goals. Target Date: 2023-07-04 Frequency:  Monthly Progress: 50 Modality: individual Related Interventions 1. Use techniques from Acceptance and Commitment Therapy to help client accept uncomfortable realities such as lack of complete control, imperfections, and uncertainty and tolerate unpleasant emotions and thoughts in order  to accomplish value-consistent goals. Objective Learn and implement problem-solving strategies for realistically addressing worries. Target Date: 2023-07-04   Frequency:  Monthly Progress: 50 Modality: individual Related Interventions 1. Assign the client a homework exercise in which he/she problem-solves a current problem.  review, reinforce success, and provide corrective feedback toward improvement. 2. Teach the client problem-solving strategies involving specifically defining a problem, generating options for addressing it, evaluating the pros and cons of each option, selecting and implementing an optional action, and reevaluating and refining the action. Objective Learn and implement calming skills to reduce overall anxiety and manage anxiety symptoms. Target Date: 2023-07-04  Frequency: Monthly Progress: 50 Modality: individual Related Interventions 1. Assign the client to read about progressive muscle relaxation and other calming strategies in relevant books or treatment manuals (e.g., Progressive Relaxation Training by Robb Matar and Alen Blew; Mastery of Your Anxiety and Worry: Workbook by Earlie Counts). 2. Assign the client homework each session in which he/she practices relaxation exercises daily, gradually applying them progressively from non-anxiety-provoking to anxiety-provoking situations; review and reinforce success while providing corrective feedback toward improvement. 3. Teach the client calming/relaxation skills (e.g., applied relaxation, progressive muscle relaxation, cue controlled relaxation; mindful breathing; biofeedback) and how to discriminate better between relaxation and tension; teach the client how to apply these skills to his/her daily life. 3. Reduce overall frequency, intensity, and duration of the anxiety so that daily functioning is not impaired. 4. Resolve the core conflict that is the source of anxiety. 5. Stabilize anxiety level while increasing ability to function on a daily basis. Diagnosis :   F43.22 Conditions For Discharge Achievement of treatment goals and objectives.  Colisha Redler, LCSW

## 2022-10-18 ENCOUNTER — Ambulatory Visit (HOSPITAL_COMMUNITY)
Admission: RE | Admit: 2022-10-18 | Discharge: 2022-10-18 | Disposition: A | Discharge status: Home / Self Care | Payer: Medicare Other | Source: Ambulatory Visit | Attending: Family Medicine | Admitting: Family Medicine

## 2022-10-18 ENCOUNTER — Other Ambulatory Visit (HOSPITAL_COMMUNITY): Payer: Self-pay | Admitting: Family Medicine

## 2022-10-18 DIAGNOSIS — J069 Acute upper respiratory infection, unspecified: Secondary | ICD-10-CM

## 2022-10-18 DIAGNOSIS — E785 Hyperlipidemia, unspecified: Secondary | ICD-10-CM | POA: Diagnosis not present

## 2022-10-18 DIAGNOSIS — I10 Essential (primary) hypertension: Secondary | ICD-10-CM | POA: Diagnosis not present

## 2022-10-18 DIAGNOSIS — E559 Vitamin D deficiency, unspecified: Secondary | ICD-10-CM | POA: Diagnosis not present

## 2022-10-18 DIAGNOSIS — Z6823 Body mass index (BMI) 23.0-23.9, adult: Secondary | ICD-10-CM | POA: Diagnosis not present

## 2022-10-19 DIAGNOSIS — E559 Vitamin D deficiency, unspecified: Secondary | ICD-10-CM | POA: Diagnosis not present

## 2022-10-19 DIAGNOSIS — I10 Essential (primary) hypertension: Secondary | ICD-10-CM | POA: Diagnosis not present

## 2022-10-19 DIAGNOSIS — E785 Hyperlipidemia, unspecified: Secondary | ICD-10-CM | POA: Diagnosis not present

## 2022-10-19 DIAGNOSIS — I671 Cerebral aneurysm, nonruptured: Secondary | ICD-10-CM | POA: Diagnosis not present

## 2022-10-24 ENCOUNTER — Ambulatory Visit: Payer: Medicare Other | Admitting: Psychology

## 2022-10-24 DIAGNOSIS — F4322 Adjustment disorder with anxiety: Secondary | ICD-10-CM

## 2022-10-24 NOTE — Progress Notes (Signed)
[facility] Counselor/Therapist Progress Note  Patient ID: Virginia Brooks, MRN: [id number],    Date: 10/24/2022  Time Spent: 10:00am-10:55am   55 minutes   Treatment Type: Individual Therapy  Reported Symptoms: stress  Mental Status Exam: Appearance:  Casual     Behavior: Appropriate  Motor: Normal  Speech/Language:  Normal Rate  Affect: Appropriate  Mood: normal  Thought process: normal  Thought content:   WNL  Sensory/Perceptual disturbances:   WNL  Orientation: oriented to person, place, time/date, and situation  Attention: Good  Concentration: Good  Memory: WNL  Fund of knowledge:  Good  Insight:   Good  Judgment:  Good  Impulse Control: Good   Risk Assessment: Danger to Self:  No Self-injurious Behavior: No Danger to Others: No Duty to Warn:no Physical Aggression / Violence:No  Access to Firearms a concern: No  Gang Involvement:No   Subjective: Pt present for face-to-face individual therapy via video.  Pt consents to telehealth video session and is aware of limitations of virtual sessions. Location of pt: home Location of therapist: home office.   Pt talked about things being stressful for her.  She has had to do a lot of caregiving for her [name].   She has had to take him to a lot of doctors appointments.   [name] has also fallen a couple of times which is worrisome.  Some people have told pt [name] needs to be in an assisted living facility of live with her.  Pt is worried about how difficult it would be to have Rudolph living with her.  Addressed pt's stress and frustrations. Pt talked about concerns about her cousin who is in the hospital.   Pt talked about her son hurting her feelings bc he did not give pt a birthday card or present.  Helped pt process her feelings.   Worked on self care strategies. Provided supportive therapy.    Interventions: Cognitive Behavioral Therapy and Insight-Oriented  Diagnosis: F43.22  Plan of Care:  Recommend ongoing therapy.   Plan to meet monthly.  Pt is progressing toward treatment goals.    Treatment Plan (Treatment Plan Target Date:  07/04/2023)  Client Abilities/Strengths  Pt is bright, engaging and motivated for therapy.  Client Treatment Preferences  Individual therapy.  Client Statement of Needs  Improve coping skills.  Symptoms  Autonomic hyperactivity (e.g., palpitations, shortness of breath, dry mouth, trouble swallowing, nausea, diarrhea). Excessive and/or unrealistic worry that is difficult to control occurring more days than not for at least 6 months about a number of events or activities. Hypervigilance (e.g., feeling constantly on edge, experiencing concentration difficulties, having trouble falling or staying asleep, exhibiting a general state of irritability). Motor tension (e.g., restlessness, tiredness, shakiness, muscle tension). Problems Addressed  Anxiety Goals 1. Enhance ability to effectively cope with the full variety of life's worries and anxieties. 2. Learn and implement coping skills that result in a reduction of anxiety and worry, and improved daily functioning. Objective Learn to accept limitations in life and commit to tolerating, rather than avoiding, unpleasant emotions while accomplishing meaningful goals. Target Date: 2023-07-04 Frequency:  Monthly Progress: 50 Modality: individual Related Interventions 1. Use techniques from Acceptance and Commitment Therapy to help client accept uncomfortable realities such as lack of complete control, imperfections, and uncertainty and tolerate unpleasant emotions and thoughts in order to accomplish value-consistent goals. Objective Learn and implement problem-solving strategies for realistically addressing worries. Target Date: 2023-07-04  Frequency:  Monthly Progress: 50 Modality: individual Related Interventions 1. Assign  the client a homework exercise in which he/she problem-solves a current problem.   review, reinforce success, and provide corrective feedback toward improvement. 2. Teach the client problem-solving strategies involving specifically defining a problem, generating options for addressing it, evaluating the pros and cons of each option, selecting and implementing an optional action, and reevaluating and refining the action. Objective Learn and implement calming skills to reduce overall anxiety and manage anxiety symptoms. Target Date: 2023-07-04  Frequency: Monthly Progress: 50 Modality: individual Related Interventions 1. Assign the client to read about progressive muscle relaxation and other calming strategies in relevant books or treatment manuals (e.g., Progressive Relaxation Training by Robb Matar and Alen Blew; Mastery of Your Anxiety and Worry: Workbook by Earlie Counts). 2. Assign the client homework each session in which he/she practices relaxation exercises daily, gradually applying them progressively from non-anxiety-provoking to anxiety-provoking situations; review and reinforce success while providing corrective feedback toward improvement. 3. Teach the client calming/relaxation skills (e.g., applied relaxation, progressive muscle relaxation, cue controlled relaxation; mindful breathing; biofeedback) and how to discriminate better between relaxation and tension; teach the client how to apply these skills to his/her daily life. 3. Reduce overall frequency, intensity, and duration of the anxiety so that daily functioning is not impaired. 4. Resolve the core conflict that is the source of anxiety. 5. Stabilize anxiety level while increasing ability to function on a daily basis. Diagnosis :   F43.22 Conditions For Discharge Achievement of treatment goals and objectives.  Jase Himmelberger, LCSW

## 2022-11-06 DIAGNOSIS — Z1231 Encounter for screening mammogram for malignant neoplasm of breast: Secondary | ICD-10-CM | POA: Diagnosis not present

## 2022-11-07 ENCOUNTER — Encounter: Payer: Self-pay | Admitting: Obstetrics and Gynecology

## 2022-11-21 ENCOUNTER — Ambulatory Visit: Payer: Medicare Other | Admitting: Psychology

## 2022-11-21 DIAGNOSIS — F4322 Adjustment disorder with anxiety: Secondary | ICD-10-CM | POA: Diagnosis not present

## 2022-11-21 NOTE — Progress Notes (Signed)
[facility] Counselor/Therapist Progress Note  Patient ID: Virginia Brooks, MRN: [id number],    Date: 11/21/2022  Time Spent: 9:00am-9:55am   55 minutes   Treatment Type: Individual Therapy  Reported Symptoms: stress  Mental Status Exam: Appearance:  Casual     Behavior: Appropriate  Motor: Normal  Speech/Language:  Normal Rate  Affect: Appropriate  Mood: normal  Thought process: normal  Thought content:   WNL  Sensory/Perceptual disturbances:   WNL  Orientation: oriented to person, place, time/date, and situation  Attention: Good  Concentration: Good  Memory: WNL  Fund of knowledge:  Good  Insight:   Good  Judgment:  Good  Impulse Control: Good   Risk Assessment: Danger to Self:  No Self-injurious Behavior: No Danger to Others: No Duty to Warn:no Physical Aggression / Violence:No  Access to Firearms a concern: No  Gang Involvement:No   Subjective: Pt present for face-to-face individual therapy via video.  Pt consents to telehealth video session and is aware of limitations of virtual sessions. Location of pt: home Location of therapist: home office.   Pt talked about things being stressful and busy for her.  She has had to do a lot of caregiving for her brother Virginia Brooks.  Virginia Brooks has had to go to the emergency room a couple of times bc of dehydration and low blood pressure.    He is also having major dental issues and appointments.  Virginia Brooks hygiene is bad and he often smells.  Pt tries to tell him to clean himself but he does not comply.  This is very frustrating for pt.  Helped pt process her feelings and worked on Optician, dispensing.  Pt feels like she needs some time to herself to take care of herself.   Pt is going to start looking into assisted living facilities for Virginia Brooks so she has resources when needed.  Worked on self care strategies. Provided supportive therapy.    Interventions: Cognitive Behavioral Therapy and Insight-Oriented  Diagnosis:  F43.22  Plan of Care: Recommend ongoing therapy.   Plan to meet monthly.  Pt is progressing toward treatment goals.    Treatment Plan (Treatment Plan Target Date:  07/04/2023)  Client Abilities/Strengths  Pt is bright, engaging and motivated for therapy.  Client Treatment Preferences  Individual therapy.  Client Statement of Needs  Improve coping skills.  Symptoms  Autonomic hyperactivity (e.g., palpitations, shortness of breath, dry mouth, trouble swallowing, nausea, diarrhea). Excessive and/or unrealistic worry that is difficult to control occurring more days than not for at least 6 months about a number of events or activities. Hypervigilance (e.g., feeling constantly on edge, experiencing concentration difficulties, having trouble falling or staying asleep, exhibiting a general state of irritability). Motor tension (e.g., restlessness, tiredness, shakiness, muscle tension). Problems Addressed  Anxiety Goals 1. Enhance ability to effectively cope with the full variety of life's worries and anxieties. 2. Learn and implement coping skills that result in a reduction of anxiety and worry, and improved daily functioning. Objective Learn to accept limitations in life and commit to tolerating, rather than avoiding, unpleasant emotions while accomplishing meaningful goals. Target Date: 2023-07-04 Frequency:  Monthly Progress: 50 Modality: individual Related Interventions 1. Use techniques from Acceptance and Commitment Therapy to help client accept uncomfortable realities such as lack of complete control, imperfections, and uncertainty and tolerate unpleasant emotions and thoughts in order to accomplish value-consistent goals. Objective Learn and implement problem-solving strategies for realistically addressing worries. Target Date: 2023-07-04  Frequency:  Monthly Progress: 50 Modality: individual Related  Interventions 1. Assign the client a homework exercise in which he/she problem-solves a  current problem.  review, reinforce success, and provide corrective feedback toward improvement. 2. Teach the client problem-solving strategies involving specifically defining a problem, generating options for addressing it, evaluating the pros and cons of each option, selecting and implementing an optional action, and reevaluating and refining the action. Objective Learn and implement calming skills to reduce overall anxiety and manage anxiety symptoms. Target Date: 2023-07-04  Frequency: Monthly Progress: 50 Modality: individual Related Interventions 1. Assign the client to read about progressive muscle relaxation and other calming strategies in relevant books or treatment manuals (e.g., Progressive Relaxation Training by Robb Matar and Alen Blew; Mastery of Your Anxiety and Worry: Workbook by Earlie Counts). 2. Assign the client homework each session in which he/she practices relaxation exercises daily, gradually applying them progressively from non-anxiety-provoking to anxiety-provoking situations; review and reinforce success while providing corrective feedback toward improvement. 3. Teach the client calming/relaxation skills (e.g., applied relaxation, progressive muscle relaxation, cue controlled relaxation; mindful breathing; biofeedback) and how to discriminate better between relaxation and tension; teach the client how to apply these skills to his/her daily life. 3. Reduce overall frequency, intensity, and duration of the anxiety so that daily functioning is not impaired. 4. Resolve the core conflict that is the source of anxiety. 5. Stabilize anxiety level while increasing ability to function on a daily basis. Diagnosis :   F43.22 Conditions For Discharge Achievement of treatment goals and objectives.  Demontrae Gilbert, LCSW

## 2022-11-27 NOTE — Progress Notes (Signed)
72 y.o. G2P2 Widowed Caucasian female here for annual exam.    No health changes.   No GYN concerns.  No bleeding.   Some urinary urgency.  Wears a pad. Some urinary incontinence with coughing when she had Covid.  Not seeking treatment for this.   PCP:  Dr. Phillips Odor  Patient's last menstrual period was 04/02/2004.           Sexually active: No.  The current method of family planning is post menopausal status.    Exercising: Yes.     Walking 1 mile everyday Smoker:  no  Health Maintenance: Pap:  12/06/21 neg: HR HPV neg, 12/03/19 neg History of abnormal Pap:  yes, years ago per pt MMG:  11/06/22 Breast Density Cat B, BI-RADS CAT 1 neg Colonoscopy:  01/19/21 - done at [facility].  Dr. Ewing Schlein.   Does every 5 years.  BMD:   11/06/21  Result  WNL TDaP:  2017 Gardasil:   no HIV: donates blood Hep C: 12/2017 neg Screening Labs:  PCP   reports that she has never smoked. She has never used smokeless tobacco. She reports that she does not drink alcohol and does not use drugs.  Past Medical History:  Diagnosis Date   Endometrial hyperplasia without atypia, complex 04/24/2004   on hysteroscopic resection of polyps   History of COVID-19 09/27/2020   Hypercholesterolemia    Hypertension    Keratosis    Malignant melanoma of left forearm (HCC)    Shingles 11/2015   Spondylosis of cervical joint     Past Surgical History:  Procedure Laterality Date   BREAST SURGERY  Dec 2000   excision papilloma   DILATION AND CURETTAGE OF UTERUS     HYSTEROSCOPY WITH RESECTOSCOPE  04/24/2004   MINOR BREAST BIOPSY Right 1994    Current Outpatient Medications  Medication Sig Dispense Refill   ASPIRIN LOW DOSE 81 MG EC tablet Take 81 mg by mouth daily.     cetirizine (ZYRTEC) 10 MG tablet Take 10 mg by mouth daily. Takes 1/2 tablet daily     cholecalciferol (VITAMIN D) 1000 UNITS tablet Take 1,000 Units by mouth daily.     Coenzyme Q10 (COQ10) 200 MG CAPS Take 1 tablet by mouth daily.     fish oil-omega-3  fatty acids 1000 MG capsule Take 2 g by mouth daily.     ketotifen (ZADITOR) 0.025 % ophthalmic solution 1 drop 2 (two) times daily.     MULTIPLE VITAMINS PO Take 1 tablet by mouth daily.     rosuvastatin (CRESTOR) 5 MG tablet Take 1 tablet (5 mg total) by mouth daily. 90 tablet 3   amLODipine (NORVASC) 5 MG tablet Take 1 tablet (5 mg total) by mouth daily. 30 tablet 2   No current facility-administered medications for this visit.    Family History  Problem Relation Age of Onset   COPD Mother    Angina Mother    Stroke Father    Heart attack Father    Colon cancer Sister 76       colon ca   Cancer Sister    Cancer Brother        prostate/bladder/kidney ca   Colon cancer Other        79     Review of Systems  All other systems reviewed and are negative.   Exam:   BP 128/84 (BP Location: Left Arm, Patient Position: Sitting, Cuff Size: Normal)   Pulse 67   Ht 5\' 3"  (1.6  m)   Wt 133 lb (60.3 kg)   LMP 04/02/2004   SpO2 98%   BMI 23.56 kg/m     General appearance: alert, cooperative and appears stated age Head: normocephalic, without obvious abnormality, atraumatic Neck: no adenopathy, supple, symmetrical, trachea midline and thyroid normal to inspection and palpation Lungs: clear to auscultation bilaterally Breasts: normal appearance, no masses or tenderness, No nipple retraction or dimpling, No nipple discharge or bleeding, No axillary adenopathy Heart: regular rate and rhythm Abdomen: soft, non-tender; no masses, no organomegaly Extremities: extremities normal, atraumatic, no cyanosis or edema Skin: skin color, texture, turgor normal. No rashes or lesions Lymph nodes: cervical, supraclavicular, and axillary nodes normal. Neurologic: grossly normal  Pelvic: External genitalia:  no lesions              No abnormal inguinal nodes palpated.              Urethra:  normal appearing urethra with no masses, tenderness or lesions              Bartholins and Skenes: normal                  Vagina: normal appearing vagina with normal color and discharge, no lesions              Cervix: no lesions              Pap taken: no Bimanual Exam:  Uterus:  normal size, contour, position, consistency, mobility, non-tender              Adnexa: no mass, fullness, tenderness              Rectal exam: yes.  Confirms.              Anus:  normal sphincter tone, no lesions  Chaperone was present for exam:  Zadie Cleverly, PA-C  Assessment:   Well woman visit with gynecologic exam. Hx complex endometrial hyperplasia without atypia.  Hx excision of breast papilloma.  FH colon cancer.   Sister.  Hx melanoma.  FH CVD.  Plan: Mammogram screening discussed. Self breast awareness reviewed. Pap and HR HPV  2028. Guidelines for Calcium, Vitamin D, regular exercise program including cardiovascular and weight bearing exercise. Follow up in 2 years and prn.   After visit summary provided.

## 2022-12-06 DIAGNOSIS — Z85820 Personal history of malignant melanoma of skin: Secondary | ICD-10-CM | POA: Diagnosis not present

## 2022-12-06 DIAGNOSIS — L821 Other seborrheic keratosis: Secondary | ICD-10-CM | POA: Diagnosis not present

## 2022-12-06 DIAGNOSIS — D1801 Hemangioma of skin and subcutaneous tissue: Secondary | ICD-10-CM | POA: Diagnosis not present

## 2022-12-06 DIAGNOSIS — L7211 Pilar cyst: Secondary | ICD-10-CM | POA: Diagnosis not present

## 2022-12-11 ENCOUNTER — Encounter: Payer: Self-pay | Admitting: Obstetrics and Gynecology

## 2022-12-11 ENCOUNTER — Ambulatory Visit (INDEPENDENT_AMBULATORY_CARE_PROVIDER_SITE_OTHER): Payer: Medicare Other | Admitting: Obstetrics and Gynecology

## 2022-12-11 VITALS — BP 128/84 | HR 67 | Ht 63.0 in | Wt 133.0 lb

## 2022-12-11 DIAGNOSIS — Z01419 Encounter for gynecological examination (general) (routine) without abnormal findings: Secondary | ICD-10-CM

## 2022-12-11 NOTE — Patient Instructions (Signed)

## 2022-12-12 DIAGNOSIS — R7303 Prediabetes: Secondary | ICD-10-CM | POA: Diagnosis not present

## 2022-12-12 DIAGNOSIS — Z1331 Encounter for screening for depression: Secondary | ICD-10-CM | POA: Diagnosis not present

## 2022-12-12 DIAGNOSIS — E785 Hyperlipidemia, unspecified: Secondary | ICD-10-CM | POA: Diagnosis not present

## 2022-12-12 DIAGNOSIS — I10 Essential (primary) hypertension: Secondary | ICD-10-CM | POA: Diagnosis not present

## 2022-12-12 DIAGNOSIS — Z0001 Encounter for general adult medical examination with abnormal findings: Secondary | ICD-10-CM | POA: Diagnosis not present

## 2022-12-12 DIAGNOSIS — Z6823 Body mass index (BMI) 23.0-23.9, adult: Secondary | ICD-10-CM | POA: Diagnosis not present

## 2022-12-13 DIAGNOSIS — H25813 Combined forms of age-related cataract, bilateral: Secondary | ICD-10-CM | POA: Diagnosis not present

## 2022-12-13 DIAGNOSIS — Q141 Congenital malformation of retina: Secondary | ICD-10-CM | POA: Diagnosis not present

## 2022-12-13 DIAGNOSIS — H0102A Squamous blepharitis right eye, upper and lower eyelids: Secondary | ICD-10-CM | POA: Diagnosis not present

## 2022-12-13 DIAGNOSIS — H10413 Chronic giant papillary conjunctivitis, bilateral: Secondary | ICD-10-CM | POA: Diagnosis not present

## 2022-12-13 DIAGNOSIS — H0102B Squamous blepharitis left eye, upper and lower eyelids: Secondary | ICD-10-CM | POA: Diagnosis not present

## 2022-12-13 DIAGNOSIS — H04123 Dry eye syndrome of bilateral lacrimal glands: Secondary | ICD-10-CM | POA: Diagnosis not present

## 2022-12-19 ENCOUNTER — Ambulatory Visit: Payer: Medicare Other | Admitting: Psychology

## 2022-12-19 DIAGNOSIS — F4322 Adjustment disorder with anxiety: Secondary | ICD-10-CM

## 2022-12-19 NOTE — Progress Notes (Signed)
[facility] Counselor/Therapist Progress Note  Patient ID: LE INGS, MRN: [id number],    Date: 12/19/2022  Time Spent: 10:00am-10:55am   55 minutes   Treatment Type: Individual Therapy  Reported Symptoms: stress  Mental Status Exam: Appearance:  Casual     Behavior: Appropriate  Motor: Normal  Speech/Language:  Normal Rate  Affect: Appropriate  Mood: normal  Thought process: normal  Thought content:   WNL  Sensory/Perceptual disturbances:   WNL  Orientation: oriented to person, place, time/date, and situation  Attention: Good  Concentration: Good  Memory: WNL  Fund of knowledge:  Good  Insight:   Good  Judgment:  Good  Impulse Control: Good   Risk Assessment: Danger to Self:  No Self-injurious Behavior: No Danger to Others: No Duty to Warn:no Physical Aggression / Violence:No  Access to Firearms a concern: No  Gang Involvement:No   Subjective: Pt present for face-to-face individual therapy via video.  Pt consents to telehealth video session and is aware of limitations and benefits of virtual sessions. Location of pt: home Location of therapist: home office.   Pt talked about her [name].  He fell recently when they were at a restaurant and he fell on pt so she fell as well.   They were not hurt badly but it was an upsetting incident.  Pt is sore on her hip from the fall.    Pt has been busy with caregiving for [place].  Addressed the challenges and frustrations.   Pt talked about her health.  She has a lot of doctors appointments this month.  Pt talked about planning a trip to [place] with her daughter and 74 yo grandson.   Pt is looking forward to the trip.  They will travel November 1st and be there for a few days.  Pt talked about the upcoming elections.  She tries to stay out of political discussions bc her kids have opposing views.    Worked on self care strategies. Provided supportive therapy.    Interventions: Cognitive Behavioral  Therapy and Insight-Oriented  Diagnosis: F43.22  Plan of Care: Recommend ongoing therapy.   Plan to meet monthly.  Pt is progressing toward treatment goals.    Treatment Plan (Treatment Plan Target Date:  07/04/2023)  Client Abilities/Strengths  Pt is bright, engaging and motivated for therapy.  Client Treatment Preferences  Individual therapy.  Client Statement of Needs  Improve coping skills.  Symptoms  Autonomic hyperactivity (e.g., palpitations, shortness of breath, dry mouth, trouble swallowing, nausea, diarrhea). Excessive and/or unrealistic worry that is difficult to control occurring more days than not for at least 6 months about a number of events or activities. Hypervigilance (e.g., feeling constantly on edge, experiencing concentration difficulties, having trouble falling or staying asleep, exhibiting a general state of irritability). Motor tension (e.g., restlessness, tiredness, shakiness, muscle tension). Problems Addressed  Anxiety Goals 1. Enhance ability to effectively cope with the full variety of life's worries and anxieties. 2. Learn and implement coping skills that result in a reduction of anxiety and worry, and improved daily functioning. Objective Learn to accept limitations in life and commit to tolerating, rather than avoiding, unpleasant emotions while accomplishing meaningful goals. Target Date: 2023-07-04 Frequency:  Monthly Progress: 50 Modality: individual Related Interventions 1. Use techniques from Acceptance and Commitment Therapy to help client accept uncomfortable realities such as lack of complete control, imperfections, and uncertainty and tolerate unpleasant emotions and thoughts in order to accomplish value-consistent goals. Objective Learn and implement problem-solving strategies for realistically  addressing worries. Target Date: 2023-07-04  Frequency:  Monthly Progress: 50 Modality: individual Related Interventions 1. Assign the client a homework  exercise in which he/she problem-solves a current problem.  review, reinforce success, and provide corrective feedback toward improvement. 2. Teach the client problem-solving strategies involving specifically defining a problem, generating options for addressing it, evaluating the pros and cons of each option, selecting and implementing an optional action, and reevaluating and refining the action. Objective Learn and implement calming skills to reduce overall anxiety and manage anxiety symptoms. Target Date: 2023-07-04  Frequency: Monthly Progress: 50 Modality: individual Related Interventions 1. Assign the client to read about progressive muscle relaxation and other calming strategies in relevant books or treatment manuals (e.g., Progressive Relaxation Training by Robb Matar and Alen Blew; Mastery of Your Anxiety and Worry: Workbook by Earlie Counts). 2. Assign the client homework each session in which he/she practices relaxation exercises daily, gradually applying them progressively from non-anxiety-provoking to anxiety-provoking situations; review and reinforce success while providing corrective feedback toward improvement. 3. Teach the client calming/relaxation skills (e.g., applied relaxation, progressive muscle relaxation, cue controlled relaxation; mindful breathing; biofeedback) and how to discriminate better between relaxation and tension; teach the client how to apply these skills to his/her daily life. 3. Reduce overall frequency, intensity, and duration of the anxiety so that daily functioning is not impaired. 4. Resolve the core conflict that is the source of anxiety. 5. Stabilize anxiety level while increasing ability to function on a daily basis. Diagnosis :   F43.22 Conditions For Discharge Achievement of treatment goals and objectives.  Shavonna Corella, LCSW

## 2023-01-09 DIAGNOSIS — Z23 Encounter for immunization: Secondary | ICD-10-CM | POA: Diagnosis not present

## 2023-01-16 ENCOUNTER — Ambulatory Visit (INDEPENDENT_AMBULATORY_CARE_PROVIDER_SITE_OTHER): Payer: Medicare Other | Admitting: Psychology

## 2023-01-16 DIAGNOSIS — F4322 Adjustment disorder with anxiety: Secondary | ICD-10-CM

## 2023-01-16 NOTE — Progress Notes (Signed)
[facility] Counselor/Therapist Progress Note  Patient ID: Virginia Brooks, MRN: [id number],    Date: 01/16/2023  Time Spent: 10:00am-10:55am   55 minutes   Treatment Type: Individual Therapy  Reported Symptoms: stress  Mental Status Exam: Appearance:  Casual     Behavior: Appropriate  Motor: Normal  Speech/Language:  Normal Rate  Affect: Appropriate  Mood: normal  Thought process: normal  Thought content:   WNL  Sensory/Perceptual disturbances:   WNL  Orientation: oriented to person, place, time/date, and situation  Attention: Good  Concentration: Good  Memory: WNL  Fund of knowledge:  Good  Insight:   Good  Judgment:  Good  Impulse Control: Good   Risk Assessment: Danger to Self:  No Self-injurious Behavior: No Danger to Others: No Duty to Warn:no Physical Aggression / Violence:No  Access to Firearms a concern: No  Gang Involvement:No   Subjective: Pt present for face-to-face individual therapy via video.  Pt consents to telehealth video session and is aware of limitations and benefits of virtual sessions. Location of pt: home Location of therapist: home office.   Pt talked about being busy with grandchildren, church and volunteering with reading buddies.   Pt talked about her brother Virginia Brooks.   Pt has taken him to several doctors appointments. Virginia Brooks saw a neurologist bc he has been worried he has Parkinson's.  The neurologist diagnosed Virginia Brooks with Parkinson's.   Pt has been busy with caregiving for Virginia Brooks.  He had to stay at pt's house a couple of nights.   This was very stressful and disruptive to pt's sleep.  Addressed the challenges and frustrations of caregiving.   Pt worries about Virginia Brooks.   Pt is worried that Virginia Brooks may have to live with her but the caregiving is much too stressful.  Addressed placement options in nursing homes.   Pt talked about her health.    She had a lot of doctors appointments last month.  She found out her A1C is higher  than it should be. Worked on self care strategies. Provided supportive therapy.    Interventions: Cognitive Behavioral Therapy and Insight-Oriented  Diagnosis: F43.22  Plan of Care: Recommend ongoing therapy.   Plan to meet monthly.  Pt is progressing toward treatment goals.    Treatment Plan (Treatment Plan Target Date:  07/04/2023)  Client Abilities/Strengths  Pt is bright, engaging and motivated for therapy.  Client Treatment Preferences  Individual therapy.  Client Statement of Needs  Improve coping skills.  Symptoms  Autonomic hyperactivity (e.g., palpitations, shortness of breath, dry mouth, trouble swallowing, nausea, diarrhea). Excessive and/or unrealistic worry that is difficult to control occurring more days than not for at least 6 months about a number of events or activities. Hypervigilance (e.g., feeling constantly on edge, experiencing concentration difficulties, having trouble falling or staying asleep, exhibiting a general state of irritability). Motor tension (e.g., restlessness, tiredness, shakiness, muscle tension). Problems Addressed  Anxiety Goals 1. Enhance ability to effectively cope with the full variety of life's worries and anxieties. 2. Learn and implement coping skills that result in a reduction of anxiety and worry, and improved daily functioning. Objective Learn to accept limitations in life and commit to tolerating, rather than avoiding, unpleasant emotions while accomplishing meaningful goals. Target Date: 2023-07-04 Frequency:  Monthly Progress: 50 Modality: individual Related Interventions 1. Use techniques from Acceptance and Commitment Therapy to help client accept uncomfortable realities such as lack of complete control, imperfections, and uncertainty and tolerate unpleasant emotions and thoughts in order to accomplish value-consistent  goals. Objective Learn and implement problem-solving strategies for realistically addressing worries. Target Date:  2023-07-04  Frequency:  Monthly Progress: 50 Modality: individual Related Interventions 1. Assign the client a homework exercise in which he/she problem-solves a current problem.  review, reinforce success, and provide corrective feedback toward improvement. 2. Teach the client problem-solving strategies involving specifically defining a problem, generating options for addressing it, evaluating the pros and cons of each option, selecting and implementing an optional action, and reevaluating and refining the action. Objective Learn and implement calming skills to reduce overall anxiety and manage anxiety symptoms. Target Date: 2023-07-04  Frequency: Monthly Progress: 50 Modality: individual Related Interventions 1. Assign the client to read about progressive muscle relaxation and other calming strategies in relevant books or treatment manuals (e.g., Progressive Relaxation Training by Robb Matar and Alen Blew; Mastery of Your Anxiety and Worry: Workbook by Earlie Counts). 2. Assign the client homework each session in which he/she practices relaxation exercises daily, gradually applying them progressively from non-anxiety-provoking to anxiety-provoking situations; review and reinforce success while providing corrective feedback toward improvement. 3. Teach the client calming/relaxation skills (e.g., applied relaxation, progressive muscle relaxation, cue controlled relaxation; mindful breathing; biofeedback) and how to discriminate better between relaxation and tension; teach the client how to apply these skills to his/her daily life. 3. Reduce overall frequency, intensity, and duration of the anxiety so that daily functioning is not impaired. 4. Resolve the core conflict that is the source of anxiety. 5. Stabilize anxiety level while increasing ability to function on a daily basis. Diagnosis :   F43.22 Conditions For Discharge Achievement of treatment goals and objectives.  Kikue Gerhart,  LCSW

## 2023-02-13 ENCOUNTER — Ambulatory Visit: Payer: Medicare Other | Admitting: Psychology

## 2023-02-13 DIAGNOSIS — F4322 Adjustment disorder with anxiety: Secondary | ICD-10-CM | POA: Diagnosis not present

## 2023-02-13 NOTE — Progress Notes (Signed)
[facility] Counselor/Therapist Progress Note  Patient ID: Virginia Brooks, MRN: [id number],    Date: 02/13/2023  Time Spent: 10:00am-10:55am   55 minutes   Treatment Type: Individual Therapy  Reported Symptoms: stress  Mental Status Exam: Appearance:  Casual     Behavior: Appropriate  Motor: Normal  Speech/Language:  Normal Rate  Affect: Appropriate  Mood: normal  Thought process: normal  Thought content:   WNL  Sensory/Perceptual disturbances:   WNL  Orientation: oriented to person, place, time/date, and situation  Attention: Good  Concentration: Good  Memory: WNL  Fund of knowledge:  Good  Insight:   Good  Judgment:  Good  Impulse Control: Good   Risk Assessment: Danger to Self:  No Self-injurious Behavior: No Danger to Others: No Duty to Warn:no Physical Aggression / Violence:No  Access to Firearms a concern: No  Gang Involvement:No   Subjective: Pt present for face-to-face individual therapy via video.  Pt consents to telehealth video session and is aware of limitations and benefits of virtual sessions. Location of pt: home Location of therapist: home office.   Pt talked about her trip to [place] with her daughter and 11 yo grandson.   They had a very nice time.  Pt even got to fly first class.  Pt talked about her brother Virginia Brooks. Pt has been doing a lot of care giving taking him to doctors appointments and helping him with insurance changes.   Virginia Brooks was recently diagnosed with Parkinson's.  Virginia Brooks thinks he can do things that he can't do and pt has to set boundaries.  This can be frustrating.  Addressed the stress of care giving.   Pt talked about the holidays.   She is going to have her family Christmas dinner at her daughter's house.   Pt has been spending a lot of time with grandchildren.  She is very close to her grandkids.   Pt is doing a lot of volunteer work for her church.   Worked on self care strategies. Provided supportive therapy.     Interventions: Cognitive Behavioral Therapy and Insight-Oriented  Diagnosis: F43.22  Plan of Care: Recommend ongoing therapy.   Plan to meet monthly.  Pt is progressing toward treatment goals.    Treatment Plan (Treatment Plan Target Date:  07/04/2023)  Client Abilities/Strengths  Pt is bright, engaging and motivated for therapy.  Client Treatment Preferences  Individual therapy.  Client Statement of Needs  Improve coping skills.  Symptoms  Autonomic hyperactivity (e.g., palpitations, shortness of breath, dry mouth, trouble swallowing, nausea, diarrhea). Excessive and/or unrealistic worry that is difficult to control occurring more days than not for at least 6 months about a number of events or activities. Hypervigilance (e.g., feeling constantly on edge, experiencing concentration difficulties, having trouble falling or staying asleep, exhibiting a general state of irritability). Motor tension (e.g., restlessness, tiredness, shakiness, muscle tension). Problems Addressed  Anxiety Goals 1. Enhance ability to effectively cope with the full variety of life's worries and anxieties. 2. Learn and implement coping skills that result in a reduction of anxiety and worry, and improved daily functioning. Objective Learn to accept limitations in life and commit to tolerating, rather than avoiding, unpleasant emotions while accomplishing meaningful goals. Target Date: 2023-07-04 Frequency:  Monthly Progress: 50 Modality: individual Related Interventions 1. Use techniques from Acceptance and Commitment Therapy to help client accept uncomfortable realities such as lack of complete control, imperfections, and uncertainty and tolerate unpleasant emotions and thoughts in order to accomplish value-consistent goals. Objective Learn and  implement problem-solving strategies for realistically addressing worries. Target Date: 2023-07-04  Frequency:  Monthly Progress: 50 Modality: individual Related  Interventions 1. Assign the client a homework exercise in which he/she problem-solves a current problem.  review, reinforce success, and provide corrective feedback toward improvement. 2. Teach the client problem-solving strategies involving specifically defining a problem, generating options for addressing it, evaluating the pros and cons of each option, selecting and implementing an optional action, and reevaluating and refining the action. Objective Learn and implement calming skills to reduce overall anxiety and manage anxiety symptoms. Target Date: 2023-07-04  Frequency: Monthly Progress: 50 Modality: individual Related Interventions 1. Assign the client to read about progressive muscle relaxation and other calming strategies in relevant books or treatment manuals (e.g., Progressive Relaxation Training by Robb Matar and Alen Blew; Mastery of Your Anxiety and Worry: Workbook by Earlie Counts). 2. Assign the client homework each session in which he/she practices relaxation exercises daily, gradually applying them progressively from non-anxiety-provoking to anxiety-provoking situations; review and reinforce success while providing corrective feedback toward improvement. 3. Teach the client calming/relaxation skills (e.g., applied relaxation, progressive muscle relaxation, cue controlled relaxation; mindful breathing; biofeedback) and how to discriminate better between relaxation and tension; teach the client how to apply these skills to his/her daily life. 3. Reduce overall frequency, intensity, and duration of the anxiety so that daily functioning is not impaired. 4. Resolve the core conflict that is the source of anxiety. 5. Stabilize anxiety level while increasing ability to function on a daily basis. Diagnosis :   F43.22 Conditions For Discharge Achievement of treatment goals and objectives.  Virginia Cea, LCSW

## 2023-03-20 ENCOUNTER — Ambulatory Visit: Payer: Medicare Other | Admitting: Psychology

## 2023-03-20 DIAGNOSIS — Z20828 Contact with and (suspected) exposure to other viral communicable diseases: Secondary | ICD-10-CM | POA: Diagnosis not present

## 2023-03-20 DIAGNOSIS — F4322 Adjustment disorder with anxiety: Secondary | ICD-10-CM | POA: Diagnosis not present

## 2023-03-20 DIAGNOSIS — Z6822 Body mass index (BMI) 22.0-22.9, adult: Secondary | ICD-10-CM | POA: Diagnosis not present

## 2023-03-20 DIAGNOSIS — R6889 Other general symptoms and signs: Secondary | ICD-10-CM | POA: Diagnosis not present

## 2023-03-20 DIAGNOSIS — A493 Mycoplasma infection, unspecified site: Secondary | ICD-10-CM | POA: Diagnosis not present

## 2023-03-20 NOTE — Progress Notes (Signed)
[facility] Counselor/Therapist Progress Note  Patient ID: CAILYN GUINANE, MRN: [id number],    Date: 03/20/2023  Time Spent: 10:00am-10:55am   55 minutes   Treatment Type: Individual Therapy  Reported Symptoms: stress  Mental Status Exam: Appearance:  Casual     Behavior: Appropriate  Motor: Normal  Speech/Language:  Normal Rate  Affect: Appropriate  Mood: normal  Thought process: normal  Thought content:   WNL  Sensory/Perceptual disturbances:   WNL  Orientation: oriented to person, place, time/date, and situation  Attention: Good  Concentration: Good  Memory: WNL  Fund of knowledge:  Good  Insight:   Good  Judgment:  Good  Impulse Control: Good   Risk Assessment: Danger to Self:  No Self-injurious Behavior: No Danger to Others: No Duty to Warn:no Physical Aggression / Violence:No  Access to Firearms a concern: No  Gang Involvement:No   Subjective: Pt present for face-to-face individual therapy via video.  Pt consents to telehealth video session and is aware of limitations and benefits of virtual sessions. Location of pt: home Location of therapist: home office.   Pt talked about her health.   She has had a cough for a week so she went to the doctor today.  The doctor said she has a virus that is going around.   Pt was given medication that she hopes will help her heal.   Pt talked about having her family Christmas dinner at her daughter's house this past weekend.   The dinner went well.  It was a lot of work for pt bc she provides most of the food. Pt was disappointed that her sister could not attend. Pt has been doing her volunteer work.  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  She also does a lot of work for [employer].   Pt talked about her [name]. Pt has been doing a lot of care giving taking him to doctors appointments.  [name] is trying to cope with being recently diagnosed with Parkinson's.  [name] thinks he can do things that he can't do  and pt has to set boundaries.  This can be frustrating.  Addressed the stress of care giving.    Worked on self care strategies. Provided supportive therapy.    Interventions: Cognitive Behavioral Therapy and Insight-Oriented  Diagnosis: F43.22  Plan of Care: Recommend ongoing therapy.   Plan to meet monthly.  Pt is progressing toward treatment goals.    Treatment Plan (Treatment Plan Target Date:  07/04/2023)  Client Abilities/Strengths  Pt is bright, engaging and motivated for therapy.  Client Treatment Preferences  Individual therapy.  Client Statement of Needs  Improve coping skills.  Symptoms  Autonomic hyperactivity (e.g., palpitations, shortness of breath, dry mouth, trouble swallowing, nausea, diarrhea). Excessive and/or unrealistic worry that is difficult to control occurring more days than not for at least 6 months about a number of events or activities. Hypervigilance (e.g., feeling constantly on edge, experiencing concentration difficulties, having trouble falling or staying asleep, exhibiting a general state of irritability). Motor tension (e.g., restlessness, tiredness, shakiness, muscle tension). Problems Addressed  Anxiety Goals 1. Enhance ability to effectively cope with the full variety of life's worries and anxieties. 2. Learn and implement coping skills that result in a reduction of anxiety and worry, and improved daily functioning. Objective Learn to accept limitations in life and commit to tolerating, rather than avoiding, unpleasant emotions while accomplishing meaningful goals. Target Date: 2023-07-04 Frequency:  Monthly Progress: 50 Modality: individual Related Interventions 1.  Use techniques from Acceptance and Commitment Therapy to help client accept uncomfortable realities such as lack of complete control, imperfections, and uncertainty and tolerate unpleasant emotions and thoughts in order to accomplish value-consistent goals. Objective Learn and implement  problem-solving strategies for realistically addressing worries. Target Date: 2023-07-04  Frequency:  Monthly Progress: 50 Modality: individual Related Interventions 1. Assign the client a homework exercise in which he/she problem-solves a current problem.  review, reinforce success, and provide corrective feedback toward improvement. 2. Teach the client problem-solving strategies involving specifically defining a problem, generating options for addressing it, evaluating the pros and cons of each option, selecting and implementing an optional action, and reevaluating and refining the action. Objective Learn and implement calming skills to reduce overall anxiety and manage anxiety symptoms. Target Date: 2023-07-04  Frequency: Monthly Progress: 50 Modality: individual Related Interventions 1. Assign the client to read about progressive muscle relaxation and other calming strategies in relevant books or treatment manuals (e.g., Progressive Relaxation Training by Robb Matar and Alen Blew; Mastery of Your Anxiety and Worry: Workbook by Earlie Counts). 2. Assign the client homework each session in which he/she practices relaxation exercises daily, gradually applying them progressively from non-anxiety-provoking to anxiety-provoking situations; review and reinforce success while providing corrective feedback toward improvement. 3. Teach the client calming/relaxation skills (e.g., applied relaxation, progressive muscle relaxation, cue controlled relaxation; mindful breathing; biofeedback) and how to discriminate better between relaxation and tension; teach the client how to apply these skills to his/her daily life. 3. Reduce overall frequency, intensity, and duration of the anxiety so that daily functioning is not impaired. 4. Resolve the core conflict that is the source of anxiety. 5. Stabilize anxiety level while increasing ability to function on a daily basis. Diagnosis :   F43.22 Conditions For  Discharge Achievement of treatment goals and objectives.  Jasminemarie Sherrard, LCSW

## 2023-04-17 ENCOUNTER — Ambulatory Visit: Payer: Medicare Other | Admitting: Psychology

## 2023-04-17 DIAGNOSIS — F4322 Adjustment disorder with anxiety: Secondary | ICD-10-CM

## 2023-04-17 NOTE — Progress Notes (Signed)
 [facility] Counselor/Therapist Progress Note  Patient ID: Virginia Brooks, MRN: [id number],    Date: 04/17/2023  Time Spent: 10:00am-10:55am   55 minutes   Treatment Type: Individual Therapy  Reported Symptoms: stress  Mental Status Exam: Appearance:  Casual     Behavior: Appropriate  Motor: Normal  Speech/Language:  Normal Rate  Affect: Appropriate  Mood: normal  Thought process: normal  Thought content:   WNL  Sensory/Perceptual disturbances:   WNL  Orientation: oriented to person, place, time/date, and situation  Attention: Good  Concentration: Good  Memory: WNL  Fund of knowledge:  Good  Insight:   Good  Judgment:  Good  Impulse Control: Good   Risk Assessment: Danger to Self:  No Self-injurious Behavior: No Danger to Others: No Duty to Warn:no Physical Aggression / Violence:No  Access to Firearms a concern: No  Gang Involvement:No   Subjective: Pt present for face-to-face individual therapy via video.  Pt consents to telehealth video session and is aware of limitations and benefits of virtual sessions. Location of pt: home Location of therapist: home office.   Pt talked about the holidays.  She had a nice Christmas but it was very busy.  After Christmas she went to [facility] with her daughter's family.   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   Pt fell on the ice and hurt herself.   She is doing ok now.   Pt talked about wanting more time with her grandchildren.  They have busy schedules the older they get.   Pt talked about her brother Virginia Brooks. Pt has been doing a lot of care giving taking him to many doctors appointments.  Virginia Brooks is having cateract surgery this week then will stay with pt for a couple of days.   Virginia Brooks thinks he can do things that he can't do and pt has to set boundaries.  This can be frustrating.   Addressed the stress of care giving.    Worked on self care strategies. Provided  supportive therapy.    Interventions: Cognitive Behavioral Therapy and Insight-Oriented  Diagnosis: F43.22  Plan of Care: Recommend ongoing therapy.   Plan to meet monthly.  Pt is progressing toward treatment goals.    Treatment Plan (Treatment Plan Target Date:  07/04/2023)  Client Abilities/Strengths  Pt is bright, engaging and motivated for therapy.  Client Treatment Preferences  Individual therapy.  Client Statement of Needs  Improve coping skills.  Symptoms  Autonomic hyperactivity (e.g., palpitations, shortness of breath, dry mouth, trouble swallowing, nausea, diarrhea). Excessive and/or unrealistic worry that is difficult to control occurring more days than not for at least 6 months about a number of events or activities. Hypervigilance (e.g., feeling constantly on edge, experiencing concentration difficulties, having trouble falling or staying asleep, exhibiting a general state of irritability). Motor tension (e.g., restlessness, tiredness, shakiness, muscle tension). Problems Addressed  Anxiety Goals 1. Enhance ability to effectively cope with the full variety of life's worries and anxieties. 2. Learn and implement coping skills that result in a reduction of anxiety and worry, and improved daily functioning. Objective Learn to accept limitations in life and commit to tolerating, rather than avoiding, unpleasant emotions while accomplishing meaningful goals. Target Date: 2023-07-04 Frequency:  Monthly Progress: 50 Modality: individual Related Interventions 1. Use techniques from Acceptance and Commitment Therapy to help client accept uncomfortable realities such as lack of complete control, imperfections, and uncertainty and tolerate unpleasant emotions and thoughts in  order to accomplish value-consistent goals. Objective Learn and implement problem-solving strategies for realistically addressing worries. Target Date: 2023-07-04  Frequency:  Monthly Progress: 50 Modality:  individual Related Interventions 1. Assign the client a homework exercise in which he/she problem-solves a current problem.  review, reinforce success, and provide corrective feedback toward improvement. 2. Teach the client problem-solving strategies involving specifically defining a problem, generating options for addressing it, evaluating the pros and cons of each option, selecting and implementing an optional action, and reevaluating and refining the action. Objective Learn and implement calming skills to reduce overall anxiety and manage anxiety symptoms. Target Date: 2023-07-04  Frequency: Monthly Progress: 50 Modality: individual Related Interventions 1. Assign the client to read about progressive muscle relaxation and other calming strategies in relevant books or treatment manuals (e.g., Progressive Relaxation Training by Rodolfo Clan and Arvil Birks; Mastery of Your Anxiety and Worry: Workbook by Rodney Clamp). 2. Assign the client homework each session in which he/she practices relaxation exercises daily, gradually applying them progressively from non-anxiety-provoking to anxiety-provoking situations; review and reinforce success while providing corrective feedback toward improvement. 3. Teach the client calming/relaxation skills (e.g., applied relaxation, progressive muscle relaxation, cue controlled relaxation; mindful breathing; biofeedback) and how to discriminate better between relaxation and tension; teach the client how to apply these skills to his/her daily life. 3. Reduce overall frequency, intensity, and duration of the anxiety so that daily functioning is not impaired. 4. Resolve the core conflict that is the source of anxiety. 5. Stabilize anxiety level while increasing ability to function on a daily basis. Diagnosis :   F43.22 Conditions For Discharge Achievement of treatment goals and objectives.  Virginia Cerniglia, LCSW

## 2023-05-15 ENCOUNTER — Ambulatory Visit: Payer: Medicare Other | Admitting: Psychology

## 2023-05-15 DIAGNOSIS — F4322 Adjustment disorder with anxiety: Secondary | ICD-10-CM

## 2023-05-15 NOTE — Progress Notes (Signed)
 [facility] Counselor/Therapist Progress Note  Patient ID: Virginia Brooks, MRN: [id number],    Date: 05/15/2023  Time Spent: 10:00am-10:55am   55 minutes   Treatment Type: Individual Therapy  Reported Symptoms: stress  Mental Status Exam: Appearance:  Casual     Behavior: Appropriate  Motor: Normal  Speech/Language:  Normal Rate  Affect: Appropriate  Mood: normal  Thought process: normal  Thought content:   WNL  Sensory/Perceptual disturbances:   WNL  Orientation: oriented to person, place, time/date, and situation  Attention: Good  Concentration: Good  Memory: WNL  Fund of knowledge:  Good  Insight:   Good  Judgment:  Good  Impulse Control: Good   Risk Assessment: Danger to Self:  No Self-injurious Behavior: No Danger to Others: No Duty to Warn:no Physical Aggression / Violence:No  Access to Firearms a concern: No  Gang Involvement:No   Subjective: Pt present for face-to-face individual therapy via video.  Pt consents to telehealth video session and is aware of limitations and benefits of virtual sessions. Location of pt: home Location of therapist: home office.   Pt talked about being busy.   She has been busy with grandchildren and church and caregiving for [place].  Pt talked about her [name]. Pt has been doing a lot of care giving bc he had two cataract surgeries.    Pt kept [name] at her house for a few days to help with recovery.  This was very stressful for pt.  [name] has been sick lately and pt has been worried about him.   Pt is going to see her grandson compete in swim meets this weekend.  Pt looks forward to this.   Pt is working on getting her taxes ready.  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   Pt talked about a friend dying recently.  Helped pt process her feelings and grief.   Worked on self care strategies. Provided supportive therapy.    Interventions: Cognitive Behavioral Therapy and  Insight-Oriented  Diagnosis: F43.22  Plan of Care: Recommend ongoing therapy.   Plan to meet monthly.  Pt is progressing toward treatment goals.    Treatment Plan (Treatment Plan Target Date:  07/04/2023)  Client Abilities/Strengths  Pt is bright, engaging and motivated for therapy.  Client Treatment Preferences  Individual therapy.  Client Statement of Needs  Improve coping skills.  Symptoms  Autonomic hyperactivity (e.g., palpitations, shortness of breath, dry mouth, trouble swallowing, nausea, diarrhea). Excessive and/or unrealistic worry that is difficult to control occurring more days than not for at least 6 months about a number of events or activities. Hypervigilance (e.g., feeling constantly on edge, experiencing concentration difficulties, having trouble falling or staying asleep, exhibiting a general state of irritability). Motor tension (e.g., restlessness, tiredness, shakiness, muscle tension). Problems Addressed  Anxiety Goals 1. Enhance ability to effectively cope with the full variety of life's worries and anxieties. 2. Learn and implement coping skills that result in a reduction of anxiety and worry, and improved daily functioning. Objective Learn to accept limitations in life and commit to tolerating, rather than avoiding, unpleasant emotions while accomplishing meaningful goals. Target Date: 2023-07-04 Frequency:  Monthly Progress: 50 Modality: individual Related Interventions 1. Use techniques from Acceptance and Commitment Therapy to help client accept uncomfortable realities such as lack of complete control, imperfections, and uncertainty and tolerate unpleasant emotions and thoughts in order to accomplish value-consistent goals. Objective Learn and implement problem-solving strategies for realistically addressing worries. Target Date: 2023-07-04  Frequency:  Monthly Progress: 50 Modality: individual Related Interventions 1. Assign the client a homework exercise in  which he/she problem-solves a current problem.  review, reinforce success, and provide corrective feedback toward improvement. 2. Teach the client problem-solving strategies involving specifically defining a problem, generating options for addressing it, evaluating the pros and cons of each option, selecting and implementing an optional action, and reevaluating and refining the action. Objective Learn and implement calming skills to reduce overall anxiety and manage anxiety symptoms. Target Date: 2023-07-04  Frequency: Monthly Progress: 50 Modality: individual Related Interventions 1. Assign the client to read about progressive muscle relaxation and other calming strategies in relevant books or treatment manuals (e.g., Progressive Relaxation Training by Robb Matar and Alen Blew; Mastery of Your Anxiety and Worry: Workbook by Earlie Counts). 2. Assign the client homework each session in which he/she practices relaxation exercises daily, gradually applying them progressively from non-anxiety-provoking to anxiety-provoking situations; review and reinforce success while providing corrective feedback toward improvement. 3. Teach the client calming/relaxation skills (e.g., applied relaxation, progressive muscle relaxation, cue controlled relaxation; mindful breathing; biofeedback) and how to discriminate better between relaxation and tension; teach the client how to apply these skills to his/her daily life. 3. Reduce overall frequency, intensity, and duration of the anxiety so that daily functioning is not impaired. 4. Resolve the core conflict that is the source of anxiety. 5. Stabilize anxiety level while increasing ability to function on a daily basis. Diagnosis :   F43.22 Conditions For Discharge Achievement of treatment goals and objectives.  Silviano Neuser, LCSW

## 2023-06-12 ENCOUNTER — Ambulatory Visit: Payer: Federal, State, Local not specified - PPO | Admitting: Psychology

## 2023-06-12 DIAGNOSIS — F4322 Adjustment disorder with anxiety: Secondary | ICD-10-CM | POA: Diagnosis not present

## 2023-06-12 NOTE — Progress Notes (Signed)
 [facility] Counselor/Therapist Progress Note  Patient ID: BLOSSOM CRUME, MRN: [id number],    Date: 06/12/2023  Time Spent: 10:00am-10:55am   55 minutes   Treatment Type: Individual Therapy  Reported Symptoms: stress  Mental Status Exam: Appearance:  Casual     Behavior: Appropriate  Motor: Normal  Speech/Language:  Normal Rate  Affect: Appropriate  Mood: normal  Thought process: normal  Thought content:   WNL  Sensory/Perceptual disturbances:   WNL  Orientation: oriented to person, place, time/date, and situation  Attention: Good  Concentration: Good  Memory: WNL  Fund of knowledge:  Good  Insight:   Good  Judgment:  Good  Impulse Control: Good   Risk Assessment: Danger to Self:  No Self-injurious Behavior: No Danger to Others: No Duty to Warn:no Physical Aggression / Violence:No  Access to Firearms a concern: No  Gang Involvement:No   Subjective: Pt present for face-to-face individual therapy via video.  Pt consents to telehealth video session and is aware of limitations and benefits of virtual sessions. Location of pt: home Location of therapist: home office.   Pt talked about her grandchildren.  She is very involved in their lives and accomplishments.  Pt is a supportive grandmother.   Pt talked about her [name].  He has had a lot of doctors appointments that pt has transported him to.  Addressed pt's care giving experiences with [name].  Pt often feels frustrated with [name] bc it can be challenging and stressful to deal with him.    Worked on Optician, dispensing.  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   Worked on self care strategies. Provided supportive therapy.    Interventions: Cognitive Behavioral Therapy and Insight-Oriented  Diagnosis: F43.22  Plan of Care: Recommend ongoing therapy.   Plan to meet monthly.  Pt is progressing toward  treatment goals.    Treatment Plan (Treatment Plan Target Date:  07/04/2023)  Client Abilities/Strengths  Pt is bright, engaging and motivated for therapy.  Client Treatment Preferences  Individual therapy.  Client Statement of Needs  Improve coping skills.  Symptoms  Autonomic hyperactivity (e.g., palpitations, shortness of breath, dry mouth, trouble swallowing, nausea, diarrhea). Excessive and/or unrealistic worry that is difficult to control occurring more days than not for at least 6 months about a number of events or activities. Hypervigilance (e.g., feeling constantly on edge, experiencing concentration difficulties, having trouble falling or staying asleep, exhibiting a general state of irritability). Motor tension (e.g., restlessness, tiredness, shakiness, muscle tension). Problems Addressed  Anxiety Goals 1. Enhance ability to effectively cope with the full variety of life's worries and anxieties. 2. Learn and implement coping skills that result in a reduction of anxiety and worry, and improved daily functioning. Objective Learn to accept limitations in life and commit to tolerating, rather than avoiding, unpleasant emotions while accomplishing meaningful goals. Target Date: 2023-07-04 Frequency:  Monthly Progress: 50 Modality: individual Related Interventions 1. Use techniques from Acceptance and Commitment Therapy to help client accept uncomfortable realities such as lack of complete control, imperfections, and uncertainty and tolerate unpleasant emotions and thoughts in order to accomplish value-consistent goals. Objective Learn and implement problem-solving strategies for realistically addressing worries. Target Date: 2023-07-04  Frequency:  Monthly Progress: 50 Modality: individual Related Interventions 1. Assign the client a homework exercise in which he/she problem-solves a current problem.  review, reinforce success, and provide corrective feedback toward  improvement. 2. Teach the client  problem-solving strategies involving specifically defining a problem, generating options for addressing it, evaluating the pros and cons of each option, selecting and implementing an optional action, and reevaluating and refining the action. Objective Learn and implement calming skills to reduce overall anxiety and manage anxiety symptoms. Target Date: 2023-07-04  Frequency: Monthly Progress: 50 Modality: individual Related Interventions 1. Assign the client to read about progressive muscle relaxation and other calming strategies in relevant books or treatment manuals (e.g., Progressive Relaxation Training by Robb Matar and Alen Blew; Mastery of Your Anxiety and Worry: Workbook by Earlie Counts). 2. Assign the client homework each session in which he/she practices relaxation exercises daily, gradually applying them progressively from non-anxiety-provoking to anxiety-provoking situations; review and reinforce success while providing corrective feedback toward improvement. 3. Teach the client calming/relaxation skills (e.g., applied relaxation, progressive muscle relaxation, cue controlled relaxation; mindful breathing; biofeedback) and how to discriminate better between relaxation and tension; teach the client how to apply these skills to his/her daily life. 3. Reduce overall frequency, intensity, and duration of the anxiety so that daily functioning is not impaired. 4. Resolve the core conflict that is the source of anxiety. 5. Stabilize anxiety level while increasing ability to function on a daily basis. Diagnosis :   F43.22 Conditions For Discharge Achievement of treatment goals and objectives.  Gissell Barra, LCSW

## 2023-07-10 ENCOUNTER — Ambulatory Visit: Payer: Federal, State, Local not specified - PPO | Admitting: Psychology

## 2023-07-10 DIAGNOSIS — F4322 Adjustment disorder with anxiety: Secondary | ICD-10-CM

## 2023-07-10 NOTE — Progress Notes (Addendum)
 [facility] Counselor Initial Adult Exam  Name: Virginia Brooks Date: 07/10/2023 MRN: [id number] DOB: Jul 31, 1950 PCP: Virginia Amel, MD  Time spent: 10:00am-10:55am  55 minutes  Guardian/Payee:  Virginia Brooks requested: No   Reason for Visit /Presenting Problem: Pt present for face-to-face initial assessment update via video.  Pt consents to telehealth video session and is aware of limitations and benefits of virtual sessions. Location of pt: home Location of therapist: home office.  Pt has anxiety at times regarding family dynamics and various life stressors.   Pt has challenges care giving for her brother Virginia Brooks.  She states that she does not have any place to talk about her true feelings and feels that therapy is a very helpful place to talk.  Reviewed pt's treatment plan for annual update.  Updated pt's treatment plan and IA.  Pt participated in setting treatment goals.   Pt wants to continue to have a safe place to talk and to improve coping skills.   Plan to continue to meet monthly.    Mental Status Exam: Appearance:   Casual and Neat     Behavior:  Appropriate  Motor:  Normal  Speech/Language:   Normal Rate  Affect:  Appropriate  Mood:  normal  Thought process:  normal  Thought content:    WNL  Sensory/Perceptual disturbances:    WNL  Orientation:  oriented to person, place, time/date, and situation  Attention:  Good  Concentration:  Good  Memory:  WNL  Fund of knowledge:   Good  Insight:    Good  Judgment:   Good  Impulse Control:  Good    Reported Symptoms:  stress  Risk Assessment: Danger to Self:  No Self-injurious Behavior: No Danger to Others: No Duty to Warn:no Physical Aggression / Violence:No  Access to Firearms a concern: No  Gang Involvement:No  Patient / guardian was educated about steps to take if suicide or homicide risk level increases between visits: n/a While future psychiatric events cannot be accurately predicted, the  patient does not currently require acute inpatient psychiatric care and does not currently meet [facility]  involuntary commitment criteria.  Substance Abuse History: Current substance abuse: No     Past Psychiatric History:   No previous psychological problems have been observed Outpatient Providers:n/a History of Psych Hospitalization: No  Psychological Testing:  n/a    Abuse History:  Victim of: No.,  n/a    Report needed: No. Victim of Neglect:No. Perpetrator of  n/a   Witness / Exposure to Domestic Violence: No   Protective Services Involvement: No  Witness to [employer] Violence:  No   Family History:  Family History  Problem Relation Age of Onset   COPD Mother    Angina Mother    Stroke Father    Heart attack Father    Colon cancer Sister 56       colon ca   Cancer Sister    Cancer Brother        prostate/bladder/kidney ca   Colon cancer Other        3     Living situation: the patient lives alone.  Pt grew up with both parents and 3 brothers and 1 sister.  Father had a stroke and was cared for at home for 5 years.  A year and a half later pt's mother and sister were killed crossing the street.  [name] mother was 39 yrs old.  Pt lost both of her parents when she was  38.  No family history of mental illness , substance abuse or abuse.   [name] sister is immobile.  Sister's daughter was shot and killed in her own home a few years ago in a home invasion.    Sexual Orientation: Straight  Relationship Status: widowed  Name of spouse / other:Virginia Brooks If a parent, number of children / ages:2 adult children Pt has been widowed for 19 yrs.  Pt has 2 kids and 4 grandchildren.   [name] husband died of esophageal cancer.  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   Pt believes that her husband is in [place] so her faith helps her with the loss.   Pt tells her grandchildren about her husband Virginia Brooks so she can keep his memory alive.   Pt still wears her wedding ring.  Pt does not want  another relationship.  She feels like she has a full life and enjoys not having to be accountable to a man.   Pt has a daughter, son and a granddaughter and 3 grandsons.   Pt is very involved with family.  Support Systems: lives alone  Financial Stress:  No   Income/Employment/Disability: Neurosurgeon: No   Educational History: Education: Risk manager: Protestant  Any cultural differences that may affect / interfere with treatment:  not applicable   Recreation/Hobbies: Pt enjoys spending time with family and volunteering at church.   Stressors: Marital or family conflict    Strengths: Supportive Relationships, Family, Church, Spirituality, Hopefulness, Journalist, newspaper, and Able to Communicate Effectively  Barriers:  none   Legal History: Pending legal issue / charges: The patient has no significant history of legal issues. History of legal issue / charges:  n/a  Medical History/Surgical History: reviewed Past Medical History:  Diagnosis Date   Endometrial hyperplasia without atypia, complex 04/24/2004   on hysteroscopic resection of polyps   History of COVID-19 09/27/2020   Hypercholesterolemia    Hypertension    Keratosis    Malignant melanoma of left forearm (HCC)    Shingles 11/2015   Spondylosis of cervical joint     Past Surgical History:  Procedure Laterality Date   BREAST SURGERY  Dec 2000   excision papilloma   DILATION AND CURETTAGE OF UTERUS     HYSTEROSCOPY WITH RESECTOSCOPE  04/24/2004   MINOR BREAST BIOPSY Right 1994    Medications: Current Outpatient Medications  Medication Sig Dispense Refill   amLODipine  (NORVASC ) 5 MG tablet Take 1 tablet (5 mg total) by mouth daily. 30 tablet 2   ASPIRIN LOW DOSE 81 MG EC tablet Take 81 mg by mouth daily.     cetirizine (ZYRTEC) 10 MG tablet Take 10 mg by mouth daily. Takes 1/2 tablet daily     cholecalciferol (VITAMIN D) 1000 UNITS tablet  Take 1,000 Units by mouth daily.     Coenzyme Q10 (COQ10) 200 MG CAPS Take 1 tablet by mouth daily.     fish oil-omega-3 fatty acids 1000 MG capsule Take 2 g by mouth daily.     ketotifen (ZADITOR) 0.025 % ophthalmic solution 1 drop 2 (two) times daily.     MULTIPLE VITAMINS PO Take 1 tablet by mouth daily.     rosuvastatin  (CRESTOR ) 5 MG tablet Take 1 tablet (5 mg total) by mouth daily. 90 tablet 3   No current facility-administered medications for this visit.    No Known Allergies  Diagnoses:  F43.22  Plan of Care: Recommend ongoing therapy.   Pt  participated in setting treatment goals.  Plan to continue to meet monthly.  Pt agrees with treatment plan.     Treatment Plan (Treatment Plan Target Date:  07/09/2024)  Client Abilities/Strengths  Pt is bright, engaging and motivated for therapy.  Client Treatment Preferences  Individual therapy.  Client Statement of Needs  Improve coping skills.  Symptoms  Autonomic hyperactivity (e.g., palpitations, shortness of breath, dry mouth, trouble swallowing, nausea, diarrhea). Excessive and/or unrealistic worry that is difficult to control occurring more days than not for at least 6 months about a number of events or activities. Hypervigilance (e.g., feeling constantly on edge, experiencing concentration difficulties, having trouble falling or staying asleep, exhibiting a general state of irritability). Motor tension (e.g., restlessness, tiredness, shakiness, muscle tension). Problems Addressed  Anxiety Goals 1. Enhance ability to effectively cope with the full variety of life's worries and anxieties. 2. Learn and implement coping skills that result in a reduction of anxiety and worry, and improved daily functioning. Objective Learn to accept limitations in life and commit to tolerating, rather than avoiding, unpleasant emotions while accomplishing meaningful goals. Target Date: 2024-07-09 Frequency:  Monthly Progress: 60 Modality:  individual Related Interventions 1. Use techniques from Acceptance and Commitment Therapy to help client accept uncomfortable realities such as lack of complete control, imperfections, and uncertainty and tolerate unpleasant emotions and thoughts in order to accomplish value-consistent goals. Objective Learn and implement problem-solving strategies for realistically addressing worries. Target Date: 2024-07-09  Frequency:  Monthly Progress: 60 Modality: individual Related Interventions 1. Assign the client a homework exercise in which he/she problem-solves a current problem.  review, reinforce success, and provide corrective feedback toward improvement. 2. Teach the client problem-solving strategies involving specifically defining a problem, generating options for addressing it, evaluating the pros and cons of each option, selecting and implementing an optional action, and reevaluating and refining the action. Objective Learn and implement calming skills to reduce overall anxiety and manage anxiety symptoms. Target Date: 2024-07-09  Frequency: Monthly Progress: 60 Modality: individual Related Interventions 1. Assign the client to read about progressive muscle relaxation and other calming strategies in relevant books or treatment manuals (e.g., Progressive Relaxation Training by Rodolfo Clan and Arvil Birks; Mastery of Your Anxiety and Worry: Workbook by Rodney Clamp). 2. Assign the client homework each session in which he/she practices relaxation exercises daily, gradually applying them progressively from non-anxiety-provoking to anxiety-provoking situations; review and reinforce success while providing corrective feedback toward improvement. 3. Teach the client calming/relaxation skills (e.g., applied relaxation, progressive muscle relaxation, cue controlled relaxation; mindful breathing; biofeedback) and how to discriminate better between relaxation and tension; teach the client how to apply these  skills to his/her daily life. 3. Reduce overall frequency, intensity, and duration of the anxiety so that daily functioning is not impaired. 4. Resolve the core conflict that is the source of anxiety. 5. Stabilize anxiety level while increasing ability to function on a daily basis. Diagnosis :   F43.22 Conditions For Discharge Achievement of treatment goals and objectives.    Daquawn Seelman, LCSW

## 2023-07-31 NOTE — Addendum Note (Signed)
 Addended by: Natalie Bailey on: 07/31/2023 11:59 AM   Modules accepted: Level of Service

## 2023-08-01 ENCOUNTER — Encounter: Payer: Self-pay | Admitting: Cardiology

## 2023-08-01 ENCOUNTER — Ambulatory Visit: Payer: Federal, State, Local not specified - PPO | Attending: Cardiology | Admitting: Cardiology

## 2023-08-01 VITALS — BP 126/58 | HR 58 | Ht 63.5 in | Wt 133.2 lb

## 2023-08-01 DIAGNOSIS — I201 Angina pectoris with documented spasm: Secondary | ICD-10-CM | POA: Diagnosis not present

## 2023-08-01 DIAGNOSIS — I10 Essential (primary) hypertension: Secondary | ICD-10-CM | POA: Insufficient documentation

## 2023-08-01 DIAGNOSIS — E782 Mixed hyperlipidemia: Secondary | ICD-10-CM | POA: Insufficient documentation

## 2023-08-01 NOTE — Progress Notes (Signed)
 Cardiology Office Note:  .   Date:  08/01/2023  ID:  Virginia Brooks, DOB 08/31/1950, MRN [id number] PCP: Minus Amel, MD  [facility] Providers Cardiologist:  Knox Perl, MD   History of Present Illness: .   Virginia Brooks is a 73 y.o. Caucasian female patient with hyperlipidemia, family history is significant for cerebral saccular aneurysms, vitamin D deficiency, Prinzmetal angina pectoris presenting symptoms during anginal include tightness in the middle of the chest with radiation to the neck.  She has had a negative treadmill exercise stress test in 2023 and an echocardiogram which was essentially normal.  She has hypertension and mixed hyperlipidemia.  Discussed the use of AI scribe software for clinical note transcription with the patient, who gave verbal consent to proceed.  History of Present Illness Virginia Brooks is a 73 year old female with hypertension and Prinzmetal's angina who presents for a two-year follow-up visit.  Her hypertension is well-controlled with amlodipine  5 mg daily, maintaining blood pressure at 126/58 mmHg. She experiences intermittent chest discomfort described as pressure, occurring monthly or bi-monthly, sometimes radiating to the jaw. These episodes resolve spontaneously, and she uses 325 mg aspirin as needed.  She has no chest discomfort with exertional activity.  Most episodes occur at rest.  Her cholesterol levels have improved since the last check in July of the previous year. She is on fish oil capsules and rosuvastatin  5 mg daily for cholesterol management.  Labs   External Labs:  Care Everywhere labs 10/19/2022:  Serum glucose 107 mg, BUN 12, creatinine 0.8, potassium 4.4, LFTs normal.  Total cholesterol 145, triglycerides 93, HDL 65, LDL 63.  Vitamin D 49.7.  Hb 13.5/HCT 40.8, platelets 200.  TSH 12/08/2021: 2.15, normal.  ROS  Review of Systems  Cardiovascular:  Positive for chest pain and palpitations. Negative for dyspnea on exertion,  leg swelling and syncope.    Physical Exam:   VS:  BP (!) 126/58 (BP Location: Left Arm, Patient Position: Sitting)   Pulse (!) 58   Ht 5' 3.5" (1.613 m)   Wt 133 lb 3.2 oz (60.4 kg)   LMP 04/02/2004   SpO2 96%   BMI 23.23 kg/m    Wt Readings from Last 3 Encounters:  08/01/23 133 lb 3.2 oz (60.4 kg)  12/11/22 133 lb (60.3 kg)  12/06/21 138 lb (62.6 kg)    Physical Exam Studies Reviewed: Aaron Aas    EKG:    EKG Interpretation Date/Time:  Thursday Aug 01 2023 09:53:28 EDT Ventricular Rate:  57 PR Interval:  148 QRS Duration:  90 QT Interval:  426 QTC Calculation: 414 R Axis:   7  Text Interpretation: EKG 08/20/2023: Normal sinus rhythm at the rate of 57 bpm, normal axis, no evidence of ischemia, normal EKG. Confirmed by Tarvaris Puglia, Jagadeesh [phone]) on 08/01/2023 10:25:16 AM    EKG 06/19/2021: Normal sinus rhythm at rate of 52 bpm, right atrial enlargement, normal axis. Nonspecific T abnormality.   Medications and allergies    No Known Allergies   Current Outpatient Medications:    amLODipine  (NORVASC ) 5 MG tablet, Take 1 tablet (5 mg total) by mouth daily., Disp: 30 tablet, Rfl: 2   ASPIRIN LOW DOSE 81 MG EC tablet, Take 81 mg by mouth daily., Disp: , Rfl:    cetirizine (ZYRTEC) 10 MG tablet, Take 10 mg by mouth daily. Takes 1/2 tablet daily, Disp: , Rfl:    cholecalciferol (VITAMIN D) 1000 UNITS tablet, Take 1,000 Units by mouth daily., Disp: ,  Rfl:    Coenzyme Q10 (COQ10) 200 MG CAPS, Take 1 tablet by mouth daily., Disp: , Rfl:    fish oil-omega-3 fatty acids 1000 MG capsule, Take 2 g by mouth daily., Disp: , Rfl:    ketotifen (ZADITOR) 0.025 % ophthalmic solution, 1 drop 2 (two) times daily., Disp: , Rfl:    MULTIPLE VITAMINS PO, Take 1 tablet by mouth daily., Disp: , Rfl:    rosuvastatin  (CRESTOR ) 5 MG tablet, Take 1 tablet (5 mg total) by mouth daily., Disp: 90 tablet, Rfl: 3   No orders of the defined types were placed in this encounter.    There are no discontinued  medications.   ASSESSMENT AND PLAN: .      ICD-10-CM   1. Prinzmetal angina (HCC)  I20.1 EKG 12-Lead    2. Primary hypertension  I10     3. Mixed hyperlipidemia  E78.2      Assessment & Plan Prinzmetal angina   She experiences intermittent chest discomfort monthly or bi-monthly, sometimes with jaw discomfort. Symptoms are well-managed with amlodipine . Although nitroglycerin spray was discussed for immediate relief of coronary spasm, she prefers her current management with amlodipine  and aspirin as needed as her symptoms are remained stable. Nitroglycerin spray offers immediate relief and lasts two years, though it is more expensive than tablets. Continue amlodipine  5 mg once daily and use aspirin 325 mg as needed for chest discomfort. Consider nitroglycerin spray if symptoms increase in frequency or severity. Increase amlodipine  dose by half a tablet if chest pain becomes more frequent or lasts longer.  Hypertension   Blood pressure is well-controlled with the current medication regimen.  Mixed hyperlipidemia   Cholesterol levels are well-controlled with the current medication regimen. The last cholesterol check in July of the previous year was within normal limits. She is taking rosuvastatin  and fish oil capsules. Continue rosuvastatin  5 mg once daily and fish oil capsules.  Leg cramps   She experiences intermittent leg cramps, not vascular in nature, managed with vinegar and water, though occasional cramps persist. Discussed alternative remedies such as mustard and magnesium. Continue using vinegar and water for leg cramps. Consider using mustard or magnesium for additional relief.  Her vascular examination is normal.  I will see her back on a as needed basis, we will request her PCP to take over all the prescriptions.  Signed,  Knox Perl, MD, [facility] 08/01/2023, 10:43 PM [facility] [address] [place], [place] [address] Phone: [phone]. Fax:  [phone]

## 2023-08-01 NOTE — Patient Instructions (Signed)

## 2023-08-07 ENCOUNTER — Ambulatory Visit (INDEPENDENT_AMBULATORY_CARE_PROVIDER_SITE_OTHER): Payer: Federal, State, Local not specified - PPO | Admitting: Psychology

## 2023-08-07 DIAGNOSIS — F4322 Adjustment disorder with anxiety: Secondary | ICD-10-CM

## 2023-08-07 NOTE — Progress Notes (Signed)
 [facility] Counselor/Therapist Progress Note  Patient ID: MARSHEILA TEALE, MRN: [id number],    Date: 08/07/2023  Time Spent: 10:00am-10:55am   55 minutes   Treatment Type: Individual Therapy  Reported Symptoms: sadness  Mental Status Exam: Appearance:  Casual     Behavior: Appropriate  Motor: Normal  Speech/Language:  Normal Rate  Affect: Appropriate  Mood: normal  Thought process: normal  Thought content:   WNL  Sensory/Perceptual disturbances:   WNL  Orientation: oriented to person, place, time/date, and situation  Attention: Good  Concentration: Good  Memory: WNL  Fund of knowledge:  Good  Insight:   Good  Judgment:  Good  Impulse Control: Good   Risk Assessment: Danger to Self:  No Self-injurious Behavior: No Danger to Others: No Duty to Warn:no Physical Aggression / Violence:No  Access to Firearms a concern: No  Gang Involvement:No   Subjective: Pt present for face-to-face individual therapy via video.  Pt consents to telehealth video session and is aware of limitations and benefits of virtual sessions. Location of pt: home Location of therapist: home office.   Pt talked about the 70 yo son of a friend dying recently.   The death may have been suicide.  Addressed the loss and how it impacts pt.  Helped pt process her feelings. Pt spent 2 weeks taking care of her grandsons when her daughter was out of town.  Pt enjoyed spending the time with her grandsons.   Pt talked about care giving for her brother [name].  She has taken [name] to many doctors appointments.  Pt has to help [name] with his medication management which gets frustrating bc he does not always comply.   Pt talked about planning to do volunteer work with her church to take supplies to [place]. Pt talked about her relationship with her son.  He hurts her feelings at times bc they both have a need to be right and he does not know when to let it go.  He has called pt stupid before which  hurt pt's feelings.  Helped pt process her feelings and relationship dynamics. Worked on self care strategies. Provided supportive therapy.  Interventions: Cognitive Behavioral Therapy and Insight-Oriented  Diagnosis: F43.22  Plan of Care: Recommend ongoing therapy.   Pt participated in setting treatment goals.  Plan to continue to meet monthly.  Pt agrees with treatment plan.     Treatment Plan (Treatment Plan Target Date:  07/09/2024)  Client Abilities/Strengths  Pt is bright, engaging and motivated for therapy.  Client Treatment Preferences  Individual therapy.  Client Statement of Needs  Improve coping skills.  Symptoms  Autonomic hyperactivity (e.g., palpitations, shortness of breath, dry mouth, trouble swallowing, nausea, diarrhea). Excessive and/or unrealistic worry that is difficult to control occurring more days than not for at least 6 months about a number of events or activities. Hypervigilance (e.g., feeling constantly on edge, experiencing concentration difficulties, having trouble falling or staying asleep, exhibiting a general state of irritability). Motor tension (e.g., restlessness, tiredness, shakiness, muscle tension). Problems Addressed  Anxiety Goals 1. Enhance ability to effectively cope with the full variety of life's worries and anxieties. 2. Learn and implement coping skills that result in a reduction of anxiety and worry, and improved daily functioning. Objective Learn to accept limitations in life and commit to tolerating, rather than avoiding, unpleasant emotions while accomplishing meaningful goals. Target Date: 2024-07-09 Frequency:  Monthly Progress: 60 Modality: individual Related Interventions 1. Use techniques from Acceptance and Commitment Therapy to help  client accept uncomfortable realities such as lack of complete control, imperfections, and uncertainty and tolerate unpleasant emotions and thoughts in order to accomplish value-consistent  goals. Objective Learn and implement problem-solving strategies for realistically addressing worries. Target Date: 2024-07-09  Frequency:  Monthly Progress: 60 Modality: individual Related Interventions 1. Assign the client a homework exercise in which he/she problem-solves a current problem.  review, reinforce success, and provide corrective feedback toward improvement. 2. Teach the client problem-solving strategies involving specifically defining a problem, generating options for addressing it, evaluating the pros and cons of each option, selecting and implementing an optional action, and reevaluating and refining the action. Objective Learn and implement calming skills to reduce overall anxiety and manage anxiety symptoms. Target Date: 2024-07-09  Frequency: Monthly Progress: 60 Modality: individual Related Interventions 1. Assign the client to read about progressive muscle relaxation and other calming strategies in relevant books or treatment manuals (e.g., Progressive Relaxation Training by Rodolfo Clan and Arvil Birks; Mastery of Your Anxiety and Worry: Workbook by Rodney Clamp). 2. Assign the client homework each session in which he/she practices relaxation exercises daily, gradually applying them progressively from non-anxiety-provoking to anxiety-provoking situations; review and reinforce success while providing corrective feedback toward improvement. 3. Teach the client calming/relaxation skills (e.g., applied relaxation, progressive muscle relaxation, cue controlled relaxation; mindful breathing; biofeedback) and how to discriminate better between relaxation and tension; teach the client how to apply these skills to his/her daily life. 3. Reduce overall frequency, intensity, and duration of the anxiety so that daily functioning is not impaired. 4. Resolve the core conflict that is the source of anxiety. 5. Stabilize anxiety level while increasing ability to function on a daily  basis. Diagnosis :   F43.22 Conditions For Discharge Achievement of treatment goals and objectives.  Chanze Teagle, LCSW

## 2023-09-04 ENCOUNTER — Ambulatory Visit (INDEPENDENT_AMBULATORY_CARE_PROVIDER_SITE_OTHER): Payer: Federal, State, Local not specified - PPO | Admitting: Psychology

## 2023-09-04 DIAGNOSIS — F4322 Adjustment disorder with anxiety: Secondary | ICD-10-CM

## 2023-09-04 NOTE — Progress Notes (Signed)
 [facility] Counselor/Therapist Progress Note  Patient ID: NEOMI LAIDLER, MRN: [id number],    Date: 09/04/2023  Time Spent: 10:00am-10:55am   55 minutes   Treatment Type: Individual Therapy  Reported Symptoms: stress  Mental Status Exam: Appearance:  Casual     Behavior: Appropriate  Motor: Normal  Speech/Language:  Normal Rate  Affect: Appropriate  Mood: normal  Thought process: normal  Thought content:   WNL  Sensory/Perceptual disturbances:   WNL  Orientation: oriented to person, place, time/date, and situation  Attention: Good  Concentration: Good  Memory: WNL  Fund of knowledge:  Good  Insight:   Good  Judgment:  Good  Impulse Control: Good   Risk Assessment: Danger to Self:  No Self-injurious Behavior: No Danger to Others: No Duty to Warn:no Physical Aggression / Violence:No  Access to Firearms a concern: No  Gang Involvement:No   Subjective: Pt present for face-to-face individual therapy via video.  Pt consents to telehealth video session and is aware of limitations and benefits of virtual sessions. Location of pt: home Location of therapist: home office.   Pt talked about today being her birthday.  She has plans with family and friends.  The big celebration will be Friday night.   Pt talked about last week being what would have been her and her husband's 23 year wedding anniversary.  [name] husband died 20 years ago.  Pt still misses him and thinks about him often.   Pt talked about care giving for her brother [name].  She has taken [name] to many doctors appointments.  Pt has to help [name] with his medication management which gets frustrating bc he does not always comply.   Pt talked about her relationship with her son.  He is pressuring her to go to [place] with them but pt does not want to travel with him bc he argues with pt a lot and has even cussed her out before.  Helped pt process her feelings and relationship dynamics.   Worked on self care  strategies. Provided supportive therapy.  Interventions: Cognitive Behavioral Therapy and Insight-Oriented  Diagnosis: F43.22  Plan of Care: Recommend ongoing therapy.   Pt participated in setting treatment goals.  Plan to continue to meet monthly.  Pt agrees with treatment plan.     Treatment Plan (Treatment Plan Target Date:  07/09/2024)  Client Abilities/Strengths  Pt is bright, engaging and motivated for therapy.  Client Treatment Preferences  Individual therapy.  Client Statement of Needs  Improve coping skills.  Symptoms  Autonomic hyperactivity (e.g., palpitations, shortness of breath, dry mouth, trouble swallowing, nausea, diarrhea). Excessive and/or unrealistic worry that is difficult to control occurring more days than not for at least 6 months about a number of events or activities. Hypervigilance (e.g., feeling constantly on edge, experiencing concentration difficulties, having trouble falling or staying asleep, exhibiting a general state of irritability). Motor tension (e.g., restlessness, tiredness, shakiness, muscle tension). Problems Addressed  Anxiety Goals 1. Enhance ability to effectively cope with the full variety of life's worries and anxieties. 2. Learn and implement coping skills that result in a reduction of anxiety and worry, and improved daily functioning. Objective Learn to accept limitations in life and commit to tolerating, rather than avoiding, unpleasant emotions while accomplishing meaningful goals. Target Date: 2024-07-09 Frequency:  Monthly Progress: 60 Modality: individual Related Interventions 1. Use techniques from Acceptance and Commitment Therapy to help client accept uncomfortable realities such as lack of complete control, imperfections, and uncertainty and tolerate unpleasant emotions and  thoughts in order to accomplish value-consistent goals. Objective Learn and implement problem-solving strategies for realistically addressing worries. Target  Date: 2024-07-09  Frequency:  Monthly Progress: 60 Modality: individual Related Interventions 1. Assign the client a homework exercise in which he/she problem-solves a current problem.  review, reinforce success, and provide corrective feedback toward improvement. 2. Teach the client problem-solving strategies involving specifically defining a problem, generating options for addressing it, evaluating the pros and cons of each option, selecting and implementing an optional action, and reevaluating and refining the action. Objective Learn and implement calming skills to reduce overall anxiety and manage anxiety symptoms. Target Date: 2024-07-09  Frequency: Monthly Progress: 60 Modality: individual Related Interventions 1. Assign the client to read about progressive muscle relaxation and other calming strategies in relevant books or treatment manuals (e.g., Progressive Relaxation Training by Rodolfo Clan and Arvil Birks; Mastery of Your Anxiety and Worry: Workbook by Rodney Clamp). 2. Assign the client homework each session in which he/she practices relaxation exercises daily, gradually applying them progressively from non-anxiety-provoking to anxiety-provoking situations; review and reinforce success while providing corrective feedback toward improvement. 3. Teach the client calming/relaxation skills (e.g., applied relaxation, progressive muscle relaxation, cue controlled relaxation; mindful breathing; biofeedback) and how to discriminate better between relaxation and tension; teach the client how to apply these skills to his/her daily life. 3. Reduce overall frequency, intensity, and duration of the anxiety so that daily functioning is not impaired. 4. Resolve the core conflict that is the source of anxiety. 5. Stabilize anxiety level while increasing ability to function on a daily basis. Diagnosis :   F43.22 Conditions For Discharge Achievement of treatment goals and objectives.  Rex Magee,  LCSW

## 2023-10-02 ENCOUNTER — Ambulatory Visit: Admitting: Psychology

## 2023-10-02 DIAGNOSIS — F4322 Adjustment disorder with anxiety: Secondary | ICD-10-CM | POA: Diagnosis not present

## 2023-10-02 NOTE — Progress Notes (Signed)
 [facility] Counselor/Therapist Progress Note  Patient ID: GIANNINA BARTOLOME, MRN: [id number],    Date: 10/02/2023  Time Spent: 10:00am-10:55am   55 minutes   Treatment Type: Individual Therapy  Reported Symptoms: stress  Mental Status Exam: Appearance:  Casual     Behavior: Appropriate  Motor: Normal  Speech/Language:  Normal Rate  Affect: Appropriate  Mood: normal  Thought process: normal  Thought content:   WNL  Sensory/Perceptual disturbances:   WNL  Orientation: oriented to person, place, time/date, and situation  Attention: Good  Concentration: Good  Memory: WNL  Fund of knowledge:  Good  Insight:   Good  Judgment:  Good  Impulse Control: Good   Risk Assessment: Danger to Self:  No Self-injurious Behavior: No Danger to Others: No Duty to Warn:no Physical Aggression / Violence:No  Access to Firearms a concern: No  Gang Involvement:No   Subjective: Pt present for face-to-face individual therapy via video.  Pt consents to telehealth video session and is aware of limitations and benefits of virtual sessions. Location of pt: home Location of therapist: home office.   Pt talked about care giving for her brother [name].  She has taken [name] to many doctors appointments.  Pt has to help [name] with his medication management which gets frustrating bc he does not always comply.  Pt had to bring [name] to her house a couple of days when it was so hot bc his trailer did not cool off enough.  Addressed the stress of care giving for [facility].   Pt talked about her relationship with her son.  He did not get pt a birthday present which hurt pt's feelings.  He also does not express gratitude for all the things pt does for him and his family.   Helped pt process her feelings and relationship dynamics.  Pt states her relationship with her daughter is better.  Pt has figured out when to not comment about things.  Pt is trying to declutter her house but she stays so busy  she has not had time for it.   Pt talked about planning to go to the mountains next week.  She is looking forward to the trip.   The end of July pt will go to the beach with her daughter and family.   Worked on self care strategies. Provided supportive therapy.  Interventions: Cognitive Behavioral Therapy and Insight-Oriented  Diagnosis: F43.22  Plan of Care: Recommend ongoing therapy.   Pt participated in setting treatment goals.  Plan to continue to meet monthly.  Pt agrees with treatment plan.     Treatment Plan (Treatment Plan Target Date:  07/09/2024)  Client Abilities/Strengths  Pt is bright, engaging and motivated for therapy.  Client Treatment Preferences  Individual therapy.  Client Statement of Needs  Improve coping skills.  Symptoms  Autonomic hyperactivity (e.g., palpitations, shortness of breath, dry mouth, trouble swallowing, nausea, diarrhea). Excessive and/or unrealistic worry that is difficult to control occurring more days than not for at least 6 months about a number of events or activities. Hypervigilance (e.g., feeling constantly on edge, experiencing concentration difficulties, having trouble falling or staying asleep, exhibiting a general state of irritability). Motor tension (e.g., restlessness, tiredness, shakiness, muscle tension). Problems Addressed  Anxiety Goals 1. Enhance ability to effectively cope with the full variety of life's worries and anxieties. 2. Learn and implement coping skills that result in a reduction of anxiety and worry, and improved daily functioning. Objective Learn to accept limitations in life and commit to  tolerating, rather than avoiding, unpleasant emotions while accomplishing meaningful goals. Target Date: 2024-07-09 Frequency:  Monthly Progress: 60 Modality: individual Related Interventions 1. Use techniques from Acceptance and Commitment Therapy to help client accept uncomfortable realities such as lack of complete control,  imperfections, and uncertainty and tolerate unpleasant emotions and thoughts in order to accomplish value-consistent goals. Objective Learn and implement problem-solving strategies for realistically addressing worries. Target Date: 2024-07-09  Frequency:  Monthly Progress: 60 Modality: individual Related Interventions 1. Assign the client a homework exercise in which he/she problem-solves a current problem.  review, reinforce success, and provide corrective feedback toward improvement. 2. Teach the client problem-solving strategies involving specifically defining a problem, generating options for addressing it, evaluating the pros and cons of each option, selecting and implementing an optional action, and reevaluating and refining the action. Objective Learn and implement calming skills to reduce overall anxiety and manage anxiety symptoms. Target Date: 2024-07-09  Frequency: Monthly Progress: 60 Modality: individual Related Interventions 1. Assign the client to read about progressive muscle relaxation and other calming strategies in relevant books or treatment manuals (e.g., Progressive Relaxation Training by Thornell and Elmer; Mastery of Your Anxiety and Worry: Workbook by Richarda armin Given). 2. Assign the client homework each session in which he/she practices relaxation exercises daily, gradually applying them progressively from non-anxiety-provoking to anxiety-provoking situations; review and reinforce success while providing corrective feedback toward improvement. 3. Teach the client calming/relaxation skills (e.g., applied relaxation, progressive muscle relaxation, cue controlled relaxation; mindful breathing; biofeedback) and how to discriminate better between relaxation and tension; teach the client how to apply these skills to his/her daily life. 3. Reduce overall frequency, intensity, and duration of the anxiety so that daily functioning is not impaired. 4. Resolve the core conflict  that is the source of anxiety. 5. Stabilize anxiety level while increasing ability to function on a daily basis. Diagnosis :   F43.22 Conditions For Discharge Achievement of treatment goals and objectives.  Marycarmen Hagey, LCSW

## 2023-10-15 DIAGNOSIS — Z6823 Body mass index (BMI) 23.0-23.9, adult: Secondary | ICD-10-CM | POA: Diagnosis not present

## 2023-10-15 DIAGNOSIS — I671 Cerebral aneurysm, nonruptured: Secondary | ICD-10-CM | POA: Diagnosis not present

## 2023-10-15 DIAGNOSIS — Z0001 Encounter for general adult medical examination with abnormal findings: Secondary | ICD-10-CM | POA: Diagnosis not present

## 2023-10-15 DIAGNOSIS — E6609 Other obesity due to excess calories: Secondary | ICD-10-CM | POA: Diagnosis not present

## 2023-10-15 DIAGNOSIS — I10 Essential (primary) hypertension: Secondary | ICD-10-CM | POA: Diagnosis not present

## 2023-10-15 DIAGNOSIS — R7303 Prediabetes: Secondary | ICD-10-CM | POA: Diagnosis not present

## 2023-10-15 DIAGNOSIS — E559 Vitamin D deficiency, unspecified: Secondary | ICD-10-CM | POA: Diagnosis not present

## 2023-10-15 DIAGNOSIS — R6889 Other general symptoms and signs: Secondary | ICD-10-CM | POA: Diagnosis not present

## 2023-10-15 DIAGNOSIS — E785 Hyperlipidemia, unspecified: Secondary | ICD-10-CM | POA: Diagnosis not present

## 2023-11-06 ENCOUNTER — Ambulatory Visit (INDEPENDENT_AMBULATORY_CARE_PROVIDER_SITE_OTHER): Admitting: Psychology

## 2023-11-06 DIAGNOSIS — F4322 Adjustment disorder with anxiety: Secondary | ICD-10-CM | POA: Diagnosis not present

## 2023-11-06 NOTE — Progress Notes (Signed)
 [facility] Counselor/Therapist Progress Note  Patient ID: Virginia Brooks, MRN: [id number],    Date: 11/06/2023  Time Spent: 10:00am-10:55am   55 minutes   Treatment Type: Individual Therapy  Reported Symptoms: stress  Mental Status Exam: Appearance:  Casual     Behavior: Appropriate  Motor: Normal  Speech/Language:  Normal Rate  Affect: Appropriate  Mood: normal  Thought process: normal  Thought content:   WNL  Sensory/Perceptual disturbances:   WNL  Orientation: oriented to person, place, time/date, and situation  Attention: Good  Concentration: Good  Memory: WNL  Fund of knowledge:  Good  Insight:   Good  Judgment:  Good  Impulse Control: Good   Risk Assessment: Danger to Self:  No Self-injurious Behavior: No Danger to Others: No Duty to Warn:no Physical Aggression / Violence:No  Access to Firearms a concern: No  Gang Involvement:No   Subjective: Pt present for face-to-face individual therapy via video.  Pt consents to telehealth video session and is aware of limitations and benefits of virtual sessions. Location of pt: home Location of therapist: home office.   Pt talked about helping her daughter with her kids.  Pt is transporting her grandkids to their activities. Pt talked about the trips she had in July.   Pt had a church retreat at [employer].   Pt made new friends from other congregations that were there.  Pt also went to the beach with her daughter Amy and family.  They had a good time together.   Pt talked about care giving for her brother [name].  She has taken [name] to many doctors appointments.  [name] is not taking care of his teeth and is losing many of them.  Pt is concerned about [name] health issues.   He has parkinsons.   Addressed the stress of care giving for [facility].   Pt talked about concern about her sister in law who is in the hospital with pneumonia.  [name] sister was alsot in the hospital with pneumonia.   Pt talked about  having to find another doctor bc her doctor is leaving.  Pt states it is stressful to try to find another provider.  Helped pt problem solve finding another PCP.   Pt talked about having trouble being motivated to declutter her house.  Worked on how pt can break down the tasks into small attainable goals.    Worked on self care strategies. Provided supportive therapy.  Interventions: Cognitive Behavioral Therapy and Insight-Oriented  Diagnosis: F43.22  Plan of Care: Recommend ongoing therapy.   Pt participated in setting treatment goals.  Plan to continue to meet monthly.  Pt agrees with treatment plan.     Treatment Plan (Treatment Plan Target Date:  07/09/2024)  Client Abilities/Strengths  Pt is bright, engaging and motivated for therapy.  Client Treatment Preferences  Individual therapy.  Client Statement of Needs  Improve coping skills.  Symptoms  Autonomic hyperactivity (e.g., palpitations, shortness of breath, dry mouth, trouble swallowing, nausea, diarrhea). Excessive and/or unrealistic worry that is difficult to control occurring more days than not for at least 6 months about a number of events or activities. Hypervigilance (e.g., feeling constantly on edge, experiencing concentration difficulties, having trouble falling or staying asleep, exhibiting a general state of irritability). Motor tension (e.g., restlessness, tiredness, shakiness, muscle tension). Problems Addressed  Anxiety Goals 1. Enhance ability to effectively cope with the full variety of life's worries and anxieties. 2. Learn and implement coping skills that result in a reduction of anxiety  and worry, and improved daily functioning. Objective Learn to accept limitations in life and commit to tolerating, rather than avoiding, unpleasant emotions while accomplishing meaningful goals. Target Date: 2024-07-09 Frequency:  Monthly Progress: 60 Modality: individual Related Interventions 1. Use techniques from Acceptance  and Commitment Therapy to help client accept uncomfortable realities such as lack of complete control, imperfections, and uncertainty and tolerate unpleasant emotions and thoughts in order to accomplish value-consistent goals. Objective Learn and implement problem-solving strategies for realistically addressing worries. Target Date: 2024-07-09  Frequency:  Monthly Progress: 60 Modality: individual Related Interventions 1. Assign the client a homework exercise in which he/she problem-solves a current problem.  review, reinforce success, and provide corrective feedback toward improvement. 2. Teach the client problem-solving strategies involving specifically defining a problem, generating options for addressing it, evaluating the pros and cons of each option, selecting and implementing an optional action, and reevaluating and refining the action. Objective Learn and implement calming skills to reduce overall anxiety and manage anxiety symptoms. Target Date: 2024-07-09  Frequency: Monthly Progress: 60 Modality: individual Related Interventions 1. Assign the client to read about progressive muscle relaxation and other calming strategies in relevant books or treatment manuals (e.g., Progressive Relaxation Training by Thornell and Elmer; Mastery of Your Anxiety and Worry: Workbook by Richarda armin Given). 2. Assign the client homework each session in which he/she practices relaxation exercises daily, gradually applying them progressively from non-anxiety-provoking to anxiety-provoking situations; review and reinforce success while providing corrective feedback toward improvement. 3. Teach the client calming/relaxation skills (e.g., applied relaxation, progressive muscle relaxation, cue controlled relaxation; mindful breathing; biofeedback) and how to discriminate better between relaxation and tension; teach the client how to apply these skills to his/her daily life. 3. Reduce overall frequency, intensity,  and duration of the anxiety so that daily functioning is not impaired. 4. Resolve the core conflict that is the source of anxiety. 5. Stabilize anxiety level while increasing ability to function on a daily basis. Diagnosis :   F43.22 Conditions For Discharge Achievement of treatment goals and objectives.  Virginia Valladares, LCSW

## 2023-11-11 ENCOUNTER — Telehealth: Payer: Self-pay | Admitting: Obstetrics and Gynecology

## 2023-11-11 NOTE — Telephone Encounter (Signed)
 Encounter reviewed and closed.

## 2023-11-11 NOTE — Telephone Encounter (Signed)
 Request received from [facility] for dx bilateral mammogram for left breast lump.   Please contact patient in follow up to this request.   If she is having a breast problem, she needs an office visit before I can order the diagnostic testing.

## 2023-11-14 NOTE — Telephone Encounter (Signed)
 Patient left message on triage line requesting return call to r/s appt.   Spoke with patient. Patient states he r sister-in-law passed away, funeral is scheduled for Monday at 2pm. Condolences to patient.   Patient declines OV with another provider on 8/15.   OV rescheduled for 8/19 at 1415, patient aware this is a work in appt. She will reschedule Dx imaging to later time.   Advised I will provide update to Dr. Nikki, our office will f/u if any additional recommendations once reviewed by Dr. Nikki.   Routing to Dr. Nikki

## 2023-11-18 ENCOUNTER — Ambulatory Visit: Admitting: Obstetrics and Gynecology

## 2023-11-18 NOTE — Progress Notes (Unsigned)
 GYNECOLOGY  VISIT   HPI: 73 y.o.   Widowed  Caucasian female   G2P2 with Patient's last menstrual period was 04/02/2004.   here for: Breast check - Left. Has a spot on her L breast, does not cause her any pain just concerned.  Has dx breast imaging at [facility] on Friday.   Has felt a knot for about 2 months.  No change in size.   No pain.    Sister in law passed away from CHF.  GYNECOLOGIC HISTORY: Patient's last menstrual period was 04/02/2004. Contraception:  PMP Menopausal hormone therapy:  n/a Last 2 paps:  12/06/21 neg HR HPV neg, 12/03/19 neg  History of abnormal Pap or positive HPV:  no Mammogram:  11/06/22 Breast Density Cat B, BIRADS Cat 1 neg         OB History     Gravida  2   Para  2   Term      Preterm      AB      Living  2      SAB      IAB      Ectopic      Multiple      Live Births                 There are no active problems to display for this patient.   Past Medical History:  Diagnosis Date   Endometrial hyperplasia without atypia, complex 04/24/2004   on hysteroscopic resection of polyps   History of COVID-19 09/27/2020   Hypercholesterolemia    Hypertension    Keratosis    Malignant melanoma of left forearm (HCC)    Shingles 11/2015   Spondylosis of cervical joint     Past Surgical History:  Procedure Laterality Date   BREAST SURGERY  Dec 2000   excision papilloma   DILATION AND CURETTAGE OF UTERUS     HYSTEROSCOPY WITH RESECTOSCOPE  04/24/2004   MINOR BREAST BIOPSY Right 1994    Current Outpatient Medications  Medication Sig Dispense Refill   amLODipine  (NORVASC ) 5 MG tablet Take 1 tablet (5 mg total) by mouth daily. 30 tablet 2   ASPIRIN LOW DOSE 81 MG EC tablet Take 81 mg by mouth daily.     cetirizine (ZYRTEC) 10 MG tablet Take 10 mg by mouth daily. Takes 1/2 tablet daily     cholecalciferol (VITAMIN D) 1000 UNITS tablet Take 1,000 Units by mouth daily.     Coenzyme Q10 (COQ10) 200 MG CAPS Take 1 tablet by mouth  daily.     fish oil-omega-3 fatty acids 1000 MG capsule Take 2 g by mouth daily.     ketotifen (ZADITOR) 0.025 % ophthalmic solution 1 drop 2 (two) times daily.     MULTIPLE VITAMINS PO Take 1 tablet by mouth daily.     rosuvastatin  (CRESTOR ) 5 MG tablet Take 1 tablet (5 mg total) by mouth daily. 90 tablet 3   No current facility-administered medications for this visit.     ALLERGIES: Patient has no known allergies.  Family History  Problem Relation Age of Onset   COPD Mother    Angina Mother    Stroke Father    Heart attack Father    Colon cancer Sister 41       colon ca   Cancer Sister    Cancer Brother        prostate/bladder/kidney ca   Colon cancer Other        60  Social History   Socioeconomic History   Marital status: Widowed    Spouse name: Not on file   Number of children: 2   Years of education: Not on file   Highest education level: Not on file  Occupational History   Not on file  Tobacco Use   Smoking status: Never   Smokeless tobacco: Never  Vaping Use   Vaping status: Never Used  Substance and Sexual Activity   Alcohol use: No    Alcohol/week: 0.0 standard drinks of alcohol   Drug use: No   Sexual activity: Not Currently    Partners: Male    Birth control/protection: Post-menopausal    Comment: less than 5, after 73 y/o, no STD, no abnormal pap, no DES  Other Topics Concern   Not on file  Social History Narrative   Not on file   Social Drivers of Health   Financial Resource Strain: Not on file  Food Insecurity: Not on file  Transportation Needs: Not on file  Physical Activity: Not on file  Stress: Not on file  Social Connections: Not on file  Intimate Partner Violence: Not on file    Review of Systems  All other systems reviewed and are negative.   PHYSICAL EXAMINATION:   BP 126/82 (BP Location: Left Arm, Patient Position: Sitting)   Pulse 62   LMP 04/02/2004   SpO2 98%     General appearance: alert, cooperative and appears  stated age   Breasts: bilateral scars, no masses or tenderness, No nipple retraction or dimpling, No nipple discharge or bleeding, No axillary or supraclavicular adenopathy   Left breast with 1 cm linear mass at 9:00.    Chaperone was present for exam:  Kari HERO, CMA  ASSESSMENT:  Left breast mass.   PLAN:  Dx bilateral mammogram and left breast US  at [facility].   {LABS (Optional):23779}  ***  total time was spent for this patient encounter, including preparation, face-to-face counseling with the patient, coordination of care, and documentation of the encounter.

## 2023-11-19 ENCOUNTER — Ambulatory Visit (INDEPENDENT_AMBULATORY_CARE_PROVIDER_SITE_OTHER): Admitting: Obstetrics and Gynecology

## 2023-11-19 ENCOUNTER — Telehealth: Payer: Self-pay | Admitting: Obstetrics and Gynecology

## 2023-11-19 ENCOUNTER — Encounter: Payer: Self-pay | Admitting: Obstetrics and Gynecology

## 2023-11-19 VITALS — BP 126/82 | HR 62

## 2023-11-19 DIAGNOSIS — N6325 Unspecified lump in the left breast, overlapping quadrants: Secondary | ICD-10-CM | POA: Diagnosis not present

## 2023-11-19 NOTE — Telephone Encounter (Signed)
 Order has been filled out and put on Dr. Cyrilla desk to be signed.

## 2023-11-19 NOTE — Telephone Encounter (Signed)
 Please schedule dx bilateral mammogram and left breast US  at [facility] for my patient.   She has a left breast mass, linear mass, at 9:00.     She has an appointment for this Friday.   Please confirm the appointment.

## 2023-11-19 NOTE — Telephone Encounter (Signed)
 I signed and gave to CMA to fax tomorrow.

## 2023-11-20 NOTE — Telephone Encounter (Signed)
 Encounter closed

## 2023-11-22 DIAGNOSIS — R928 Other abnormal and inconclusive findings on diagnostic imaging of breast: Secondary | ICD-10-CM | POA: Diagnosis not present

## 2023-11-22 DIAGNOSIS — N6325 Unspecified lump in the left breast, overlapping quadrants: Secondary | ICD-10-CM | POA: Diagnosis not present

## 2023-11-22 LAB — HM MAMMOGRAPHY

## 2023-11-25 ENCOUNTER — Ambulatory Visit: Payer: Self-pay | Admitting: Obstetrics and Gynecology

## 2023-11-25 ENCOUNTER — Encounter: Payer: Self-pay | Admitting: Obstetrics and Gynecology

## 2023-12-04 ENCOUNTER — Ambulatory Visit (INDEPENDENT_AMBULATORY_CARE_PROVIDER_SITE_OTHER): Admitting: Psychology

## 2023-12-04 DIAGNOSIS — F4322 Adjustment disorder with anxiety: Secondary | ICD-10-CM

## 2023-12-04 NOTE — Progress Notes (Signed)
 [facility] Counselor/Therapist Progress Note  Patient ID: Virginia Brooks, MRN: [id number],    Date: 12/04/2023  Time Spent: 10:00am-10:55am   55 minutes   Treatment Type: Individual Therapy  Reported Symptoms: stress  Mental Status Exam: Appearance:  Casual     Behavior: Appropriate  Motor: Normal  Speech/Language:  Normal Rate  Affect: Appropriate  Mood: normal  Thought process: normal  Thought content:   WNL  Sensory/Perceptual disturbances:   WNL  Orientation: oriented to person, place, time/date, and situation  Attention: Good  Concentration: Good  Memory: WNL  Fund of knowledge:  Good  Insight:   Good  Judgment:  Good  Impulse Control: Good   Risk Assessment: Danger to Self:  No Self-injurious Behavior: No Danger to Others: No Duty to Warn:no Physical Aggression / Violence:No  Access to Firearms a concern: No  Gang Involvement:No   Subjective: Pt present for face-to-face individual therapy via video.  Pt consents to telehealth video session and is aware of limitations and benefits of virtual sessions. Location of pt: home Location of therapist: home office.   Pt talked about her sister in law who recently passed away.   Pt attended the funeral and was asked to speak at the funeral.   Pt was nervous about it but spoke at the funeral.  Helped pt process her feelings and grief.    Pt talked about care giving for her brother Virginia Brooks.  She has taken Virginia Brooks to many doctors appointments.  Pt is concerned about Virginia Brooks health issues.   He has parkinsons.   Addressed the stress of care giving for Virginia Brooks.   Pt talked about working to Virginia Brooks her house.  Worked on how pt can break down the tasks into small attainable goals.    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   Worked on self care strategies. Provided supportive therapy.  Interventions: Cognitive Behavioral Therapy and Insight-Oriented  Diagnosis: F43.22  Plan of Care:  Recommend ongoing therapy.   Pt participated in setting treatment goals.  Plan to continue to meet monthly.  Pt agrees with treatment plan.     Treatment Plan (Treatment Plan Target Date:  07/09/2024)  Client Abilities/Strengths  Pt is bright, engaging and motivated for therapy.  Client Treatment Preferences  Individual therapy.  Client Statement of Needs  Improve coping skills.  Symptoms  Autonomic hyperactivity (e.g., palpitations, shortness of breath, dry mouth, trouble swallowing, nausea, diarrhea). Excessive and/or unrealistic worry that is difficult to control occurring more days than not for at least 6 months about a number of events or activities. Hypervigilance (e.g., feeling constantly on edge, experiencing concentration difficulties, having trouble falling or staying asleep, exhibiting a general state of irritability). Motor tension (e.g., restlessness, tiredness, shakiness, muscle tension). Problems Addressed  Anxiety Goals 1. Enhance ability to effectively cope with the full variety of life's worries and anxieties. 2. Learn and implement coping skills that result in a reduction of anxiety and worry, and improved daily functioning. Objective Learn to accept limitations in life and commit to tolerating, rather than avoiding, unpleasant emotions while accomplishing meaningful goals. Target Date: 2024-07-09 Frequency:  Monthly Progress: 60 Modality: individual Related Interventions 1. Use techniques from Acceptance and Commitment Therapy to help client accept uncomfortable realities such as lack of complete control, imperfections, and uncertainty and tolerate unpleasant emotions and thoughts in order to accomplish value-consistent goals. Objective Learn and implement problem-solving strategies for realistically addressing worries. Target Date: 2024-07-09  Frequency:  Monthly  Progress: 60 Modality: individual Related Interventions 1. Assign the client a homework exercise in which  he/she problem-solves a current problem.  review, reinforce success, and provide corrective feedback toward improvement. 2. Teach the client problem-solving strategies involving specifically defining a problem, generating options for addressing it, evaluating the pros and cons of each option, selecting and implementing an optional action, and reevaluating and refining the action. Objective Learn and implement calming skills to reduce overall anxiety and manage anxiety symptoms. Target Date: 2024-07-09  Frequency: Monthly Progress: 60 Modality: individual Related Interventions 1. Assign the client to read about progressive muscle relaxation and other calming strategies in relevant books or treatment manuals (e.g., Progressive Relaxation Training by Thornell and Virginia Brooks; Mastery of Your Anxiety and Worry: Workbook by Virginia Brooks Given). 2. Assign the client homework each session in which he/she practices relaxation exercises daily, gradually applying them progressively from non-anxiety-provoking to anxiety-provoking situations; review and reinforce success while providing corrective feedback toward improvement. 3. Teach the client calming/relaxation skills (e.g., applied relaxation, progressive muscle relaxation, cue controlled relaxation; mindful breathing; biofeedback) and how to discriminate better between relaxation and tension; teach the client how to apply these skills to his/her daily life. 3. Reduce overall frequency, intensity, and duration of the anxiety so that daily functioning is not impaired. 4. Resolve the core conflict that is the source of anxiety. 5. Stabilize anxiety level while increasing ability to function on a daily basis. Diagnosis :   F43.22 Conditions For Discharge Achievement of treatment goals and objectives.  Virginia Alcala, LCSW

## 2023-12-10 DIAGNOSIS — E785 Hyperlipidemia, unspecified: Secondary | ICD-10-CM | POA: Diagnosis not present

## 2023-12-10 DIAGNOSIS — I10 Essential (primary) hypertension: Secondary | ICD-10-CM | POA: Diagnosis not present

## 2023-12-10 DIAGNOSIS — Z0001 Encounter for general adult medical examination with abnormal findings: Secondary | ICD-10-CM | POA: Diagnosis not present

## 2023-12-10 DIAGNOSIS — Z6823 Body mass index (BMI) 23.0-23.9, adult: Secondary | ICD-10-CM | POA: Diagnosis not present

## 2023-12-10 DIAGNOSIS — Z1331 Encounter for screening for depression: Secondary | ICD-10-CM | POA: Diagnosis not present

## 2023-12-11 DIAGNOSIS — Z85820 Personal history of malignant melanoma of skin: Secondary | ICD-10-CM | POA: Diagnosis not present

## 2023-12-11 DIAGNOSIS — D2272 Melanocytic nevi of left lower limb, including hip: Secondary | ICD-10-CM | POA: Diagnosis not present

## 2023-12-11 DIAGNOSIS — D1801 Hemangioma of skin and subcutaneous tissue: Secondary | ICD-10-CM | POA: Diagnosis not present

## 2023-12-11 DIAGNOSIS — L812 Freckles: Secondary | ICD-10-CM | POA: Diagnosis not present

## 2023-12-11 DIAGNOSIS — L821 Other seborrheic keratosis: Secondary | ICD-10-CM | POA: Diagnosis not present

## 2023-12-11 DIAGNOSIS — L7211 Pilar cyst: Secondary | ICD-10-CM | POA: Diagnosis not present

## 2023-12-11 DIAGNOSIS — D2271 Melanocytic nevi of right lower limb, including hip: Secondary | ICD-10-CM | POA: Diagnosis not present

## 2023-12-11 DIAGNOSIS — D225 Melanocytic nevi of trunk: Secondary | ICD-10-CM | POA: Diagnosis not present

## 2023-12-17 ENCOUNTER — Ambulatory Visit (INDEPENDENT_AMBULATORY_CARE_PROVIDER_SITE_OTHER): Payer: Medicare Other | Admitting: Obstetrics and Gynecology

## 2023-12-17 ENCOUNTER — Encounter: Payer: Self-pay | Admitting: Obstetrics and Gynecology

## 2023-12-17 VITALS — BP 118/80 | HR 62 | Ht 63.5 in | Wt 134.0 lb

## 2023-12-17 DIAGNOSIS — Z87898 Personal history of other specified conditions: Secondary | ICD-10-CM

## 2023-12-17 DIAGNOSIS — Z01419 Encounter for gynecological examination (general) (routine) without abnormal findings: Secondary | ICD-10-CM

## 2023-12-17 DIAGNOSIS — N952 Postmenopausal atrophic vaginitis: Secondary | ICD-10-CM | POA: Diagnosis not present

## 2023-12-17 NOTE — Progress Notes (Signed)
 73 y.o. G75P2 Widowed Caucasian female here for well woman visit with gynecologic exam.  The patient is also followed for left breast lump with 1 cm linear mass at 9:00. Normal dx mammogram and breast US  11/22/23 at [place].  Some urinary urgency to get to the bathroom on time.  Up 1 - 2 times per night. Occasional dip of urine. Wears a light day pad. Rare leak with cough, laugh, sneeze.   No vaginal bleeding.   PCP: Marvine Rush, MD   Patient's last menstrual period was 04/02/2004.           Sexually active: No.  The current method of family planning is post menopausal status.    Menopausal hormone therapy:  n/a Exercising: Yes.    Walking and [employer] Smoker:  no  OB History     Gravida  2   Para  2   Term      Preterm      AB      Living  2      SAB      IAB      Ectopic      Multiple      Live Births              HEALTH MAINTENANCE: Last 2 paps: 12/06/21 neg HR HPV neg, 12/03/19 neg History of abnormal Pap or positive HPV:  no Mammogram:  11/22/23 Breast Density Cat B, BIRADS Cat 0 incomplete.  US  benign.   Colonoscopy:  01/19/21 - done at [facility].  Dr. Rosalie.   Does every 5 years. Bone Density:  11/06/21  Result  normal    Immunization History  Administered Date(s) Administered   Influenza,inj,quad, With Preservative 12/03/2016      reports that she has never smoked. She has never used smokeless tobacco. She reports that she does not drink alcohol and does not use drugs.  Past Medical History:  Diagnosis Date   Endometrial hyperplasia without atypia, complex 04/24/2004   on hysteroscopic resection of polyps   History of COVID-19 09/27/2020   Hypercholesterolemia    Hypertension    Keratosis    Malignant melanoma of left forearm (HCC)    Shingles 11/2015   Spondylosis of cervical joint     Past Surgical History:  Procedure Laterality Date   BREAST SURGERY  Dec 2000   excision papilloma   DILATION AND CURETTAGE OF UTERUS     HYSTEROSCOPY  WITH RESECTOSCOPE  04/24/2004   MINOR BREAST BIOPSY Right 1994    Current Outpatient Medications  Medication Sig Dispense Refill   amLODipine  (NORVASC ) 5 MG tablet Take 1 tablet (5 mg total) by mouth daily. 30 tablet 2   ASPIRIN LOW DOSE 81 MG EC tablet Take 81 mg by mouth daily.     cetirizine (ZYRTEC) 10 MG tablet Take 10 mg by mouth daily. Takes 1/2 tablet daily     cholecalciferol (VITAMIN D) 1000 UNITS tablet Take 1,000 Units by mouth daily.     Coenzyme Q10 (COQ10) 200 MG CAPS Take 1 tablet by mouth daily.     fish oil-omega-3 fatty acids 1000 MG capsule Take 2 g by mouth daily.     ketotifen (ZADITOR) 0.025 % ophthalmic solution 1 drop 2 (two) times daily.     MULTIPLE VITAMINS PO Take 1 tablet by mouth daily.     rosuvastatin  (CRESTOR ) 5 MG tablet Take 1 tablet (5 mg total) by mouth daily. 90 tablet 3   No current facility-administered medications for  this visit.    ALLERGIES: Patient has no known allergies.  Family History  Problem Relation Age of Onset   COPD Mother    Angina Mother    Stroke Father    Heart attack Father    Colon cancer Sister 23       colon ca   Cancer Sister    Cancer Brother        prostate/bladder/kidney ca   Colon cancer Other        79     Review of Systems  All other systems reviewed and are negative.   PHYSICAL EXAM:  BP 118/80 (BP Location: Left Arm, Patient Position: Sitting)   Pulse 62   Ht 5' 3.5 (1.613 m)   Wt 134 lb (60.8 kg)   LMP 04/02/2004   SpO2 97%   BMI 23.36 kg/m     General appearance: alert, cooperative and appears stated age Head: normocephalic, without obvious abnormality, atraumatic Neck: no adenopathy, supple, symmetrical, trachea midline and thyroid  normal to inspection and palpation Lungs: clear to auscultation bilaterally Breasts: normal appearance, no masses or tenderness, No nipple retraction or dimpling, No nipple discharge or bleeding, No axillary adenopathy Heart: regular rate and rhythm Abdomen:  soft, non-tender; no masses, no organomegaly Extremities: extremities normal, atraumatic, no cyanosis or edema Skin: skin color, texture, turgor normal. No rashes or lesions Lymph nodes: cervical, supraclavicular, and axillary nodes normal. Neurologic: grossly normal  Pelvic: External genitalia:  no lesions              No abnormal inguinal nodes palpated.              Urethra:  normal appearing urethra with no masses, tenderness or lesions              Bartholins and Skenes: normal                 Vagina: normal appearing vagina with normal color and discharge, no lesions.  Atrophy.                Cervix: no lesions              Pap taken: no Bimanual Exam:  Uterus:  normal size, contour, position, consistency, mobility, non-tender              Adnexa: no mass, fullness, tenderness              Rectal exam: yes.  Confirms.              Anus:  normal sphincter tone, no lesions  Chaperone was present for exam:  Kari HERO, CMA  ASSESSMENT: Encounter for routine well woman visit with gynecologic exam. Hx complex endometrial hyperplasia without atypia.  Hx left breast lump.  Normal exam today and normal dx imaging. Hx excision of breast papilloma.  FH colon cancer.  Sister.  Hx melanoma.  Sees dermatology yearly.  FH CVD.  PLAN: Mammogram screening discussed. Self breast awareness reviewed. Pap and HRV collected:  no.  2028. Guidelines for Calcium , Vitamin D, regular exercise program including cardiovascular and weight bearing exercise. Medication refills:  NA We discussed tx for urogenital atrophy:  vaginal moisturizers, cooking oils, vaginal vit E, vaginal estradiol.  No Rx given.  Labs with new PCP.  Follow up:  1 year.

## 2023-12-17 NOTE — Patient Instructions (Signed)

## 2023-12-18 DIAGNOSIS — H25813 Combined forms of age-related cataract, bilateral: Secondary | ICD-10-CM | POA: Diagnosis not present

## 2023-12-18 DIAGNOSIS — Q141 Congenital malformation of retina: Secondary | ICD-10-CM | POA: Diagnosis not present

## 2023-12-18 DIAGNOSIS — H10413 Chronic giant papillary conjunctivitis, bilateral: Secondary | ICD-10-CM | POA: Diagnosis not present

## 2023-12-18 DIAGNOSIS — H04123 Dry eye syndrome of bilateral lacrimal glands: Secondary | ICD-10-CM | POA: Diagnosis not present

## 2023-12-18 DIAGNOSIS — H0102B Squamous blepharitis left eye, upper and lower eyelids: Secondary | ICD-10-CM | POA: Diagnosis not present

## 2023-12-18 DIAGNOSIS — H0102A Squamous blepharitis right eye, upper and lower eyelids: Secondary | ICD-10-CM | POA: Diagnosis not present

## 2024-01-01 ENCOUNTER — Ambulatory Visit: Admitting: Psychology

## 2024-01-01 DIAGNOSIS — F4322 Adjustment disorder with anxiety: Secondary | ICD-10-CM | POA: Diagnosis not present

## 2024-01-01 NOTE — Progress Notes (Signed)
 [facility] Counselor/Therapist Progress Note  Patient ID: ADYSON VANBUREN, MRN: [id number],    Date: 01/01/2024  Time Spent: 10:00am-10:55am   55 minutes   Treatment Type: Individual Therapy  Reported Symptoms: stress  Mental Status Exam: Appearance:  Casual     Behavior: Appropriate  Motor: Normal  Speech/Language:  Normal Rate  Affect: Appropriate  Mood: normal  Thought process: normal  Thought content:   WNL  Sensory/Perceptual disturbances:   WNL  Orientation: oriented to person, place, time/date, and situation  Attention: Good  Concentration: Good  Memory: WNL  Fund of knowledge:  Good  Insight:   Good  Judgment:  Good  Impulse Control: Good   Risk Assessment: Danger to Self:  No Self-injurious Behavior: No Danger to Others: No Duty to Warn:no Physical Aggression / Violence:No  Access to Firearms a concern: No  Gang Involvement:No   Subjective: Pt present for face-to-face individual therapy via video.  Pt consents to telehealth video session and is aware of limitations and benefits of virtual sessions. Location of pt: home Location of therapist: home office.   Pt talked about having a very busy month.  She finally got the trees removed she needed to have removed in her back yard.   Pt had covid a couple of weeks ago.   September 11th was the 21st anniversary of her husband's death.  [name] family reached out to her and she thought a lot about her husband.  She can't believe it has been 21 years bc she still misses him.  Pt is in a widow's group which is helpful for her.   Pt has to have cateract surgery on both eyes this month.  Her daughter is going to help her with the recuperation period.  Pt is anxious about the surgeries.   Pt talked about care giving for her brother [name].  [name] fell and had to go the the ER.  Pt was with him for several hours in the ER.   That was very stressful.   Pt had to also help him in his home several days in a row.  Pt  is concerned about [name] health issues.   He has parkinsons.   Addressed the stress of care giving for [facility].   Pt talked about feeling so much stress that she has lost 4 lbs this past month.   Worked on [employer], dispensing.  Pt thinks it may be getting to the point in the future when [name] will need to go to a facility.  [name] does not want to leave his home so pt is trying to keep him home as long as possible but it is difficult.  Pt talked about a close friend Peggy dying in September.  Pt is very sad about the loss.  Winton was 75 years old and died in a fire.  Helped pt process her feelings and grief.  Worked on self care strategies. Provided supportive therapy.  Interventions: Cognitive Behavioral Therapy and Insight-Oriented  Diagnosis: F43.22  Plan of Care: Recommend ongoing therapy.   Pt participated in setting treatment goals.  Plan to continue to meet monthly.  Pt agrees with treatment plan.     Treatment Plan (Treatment Plan Target Date:  07/09/2024)  Client Abilities/Strengths  Pt is bright, engaging and motivated for therapy.  Client Treatment Preferences  Individual therapy.  Client Statement of Needs  Improve coping skills.  Symptoms  Autonomic hyperactivity (e.g., palpitations, shortness of breath, dry mouth, trouble swallowing, nausea, diarrhea). Excessive and/or  unrealistic worry that is difficult to control occurring more days than not for at least 6 months about a number of events or activities. Hypervigilance (e.g., feeling constantly on edge, experiencing concentration difficulties, having trouble falling or staying asleep, exhibiting a general state of irritability). Motor tension (e.g., restlessness, tiredness, shakiness, muscle tension). Problems Addressed  Anxiety Goals 1. Enhance ability to effectively cope with the full variety of life's worries and anxieties. 2. Learn and implement coping skills that result in a reduction of anxiety and worry, and improved  daily functioning. Objective Learn to accept limitations in life and commit to tolerating, rather than avoiding, unpleasant emotions while accomplishing meaningful goals. Target Date: 2024-07-09 Frequency:  Monthly Progress: 60 Modality: individual Related Interventions 1. Use techniques from Acceptance and Commitment Therapy to help client accept uncomfortable realities such as lack of complete control, imperfections, and uncertainty and tolerate unpleasant emotions and thoughts in order to accomplish value-consistent goals. Objective Learn and implement problem-solving strategies for realistically addressing worries. Target Date: 2024-07-09  Frequency:  Monthly Progress: 60 Modality: individual Related Interventions 1. Assign the client a homework exercise in which he/she problem-solves a current problem.  review, reinforce success, and provide corrective feedback toward improvement. 2. Teach the client problem-solving strategies involving specifically defining a problem, generating options for addressing it, evaluating the pros and cons of each option, selecting and implementing an optional action, and reevaluating and refining the action. Objective Learn and implement calming skills to reduce overall anxiety and manage anxiety symptoms. Target Date: 2024-07-09  Frequency: Monthly Progress: 60 Modality: individual Related Interventions 1. Assign the client to read about progressive muscle relaxation and other calming strategies in relevant books or treatment manuals (e.g., Progressive Relaxation Training by Thornell and Elmer; Mastery of Your Anxiety and Worry: Workbook by Richarda armin Given). 2. Assign the client homework each session in which he/she practices relaxation exercises daily, gradually applying them progressively from non-anxiety-provoking to anxiety-provoking situations; review and reinforce success while providing corrective feedback toward improvement. 3. Teach the client  calming/relaxation skills (e.g., applied relaxation, progressive muscle relaxation, cue controlled relaxation; mindful breathing; biofeedback) and how to discriminate better between relaxation and tension; teach the client how to apply these skills to his/her daily life. 3. Reduce overall frequency, intensity, and duration of the anxiety so that daily functioning is not impaired. 4. Resolve the core conflict that is the source of anxiety. 5. Stabilize anxiety level while increasing ability to function on a daily basis. Diagnosis :   F43.22 Conditions For Discharge Achievement of treatment goals and objectives.  Kasim Mccorkle, LCSW

## 2024-01-10 DIAGNOSIS — H25811 Combined forms of age-related cataract, right eye: Secondary | ICD-10-CM | POA: Diagnosis not present

## 2024-01-10 HISTORY — PX: CATARACT EXTRACTION BILATERAL W/ ANTERIOR VITRECTOMY: SHX1304

## 2024-01-14 ENCOUNTER — Ambulatory Visit: Admitting: Obstetrics and Gynecology

## 2024-01-24 DIAGNOSIS — H25812 Combined forms of age-related cataract, left eye: Secondary | ICD-10-CM | POA: Diagnosis not present

## 2024-02-05 ENCOUNTER — Ambulatory Visit: Admitting: Psychology

## 2024-02-05 DIAGNOSIS — F4322 Adjustment disorder with anxiety: Secondary | ICD-10-CM

## 2024-02-05 NOTE — Progress Notes (Signed)
 [facility] Counselor/Therapist Progress Note  Patient ID: Virginia Brooks, MRN: [id number],    Date: 02/05/2024  Time Spent: 10:00am-10:55am   55 minutes   Treatment Type: Individual Therapy  Reported Symptoms: stress  Mental Status Exam: Appearance:  Casual     Behavior: Appropriate  Motor: Normal  Speech/Language:  Normal Rate  Affect: Appropriate  Mood: normal  Thought process: normal  Thought content:   WNL  Sensory/Perceptual disturbances:   WNL  Orientation: oriented to person, place, time/date, and situation  Attention: Good  Concentration: Good  Memory: WNL  Fund of knowledge:  Good  Insight:   Good  Judgment:  Good  Impulse Control: Good   Risk Assessment: Danger to Self:  No Self-injurious Behavior: No Danger to Others: No Duty to Warn:no Physical Aggression / Violence:No  Access to Firearms a concern: No  Gang Involvement:No   Subjective: Pt present for face-to-face individual therapy via video.  Pt consents to telehealth video session and is aware of limitations and benefits of virtual sessions. Location of pt: home Location of therapist: home office.   Pt talked about having cataract surgery on both eyes last month.  Her daughter helped her with the recuperation period.  The surgeries went well.    Pt talked about 3 people from church passing away this past month.  Pt has attended the funerals.  Helped pt process her feelings and grief.   Pt talked about care giving for her brother [name].   Pt is concerned about [name] health issues.   He has parkinsons.   Addressed the stress of care giving for [facility].  Pt is trying to not have to go to [place] house every day to pace herself.  Worked on stress management and healthy boundary setting.  [name] grandson is applying for colleges and pt is worrying about him getting into the colleges that he wants.   Pt has been helping her sister's husband who has had back surgeries.  Pt is taking them meals.   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 Worked on self care strategies. Provided supportive therapy.  Interventions: Cognitive Behavioral Therapy and Insight-Oriented  Diagnosis: F43.22  Plan of Care: Recommend ongoing therapy.   Pt participated in setting treatment goals.  Plan to continue to meet monthly.  Pt agrees with treatment plan.     Treatment Plan (Treatment Plan Target Date:  07/09/2024)  Client Abilities/Strengths  Pt is bright, engaging and motivated for therapy.  Client Treatment Preferences  Individual therapy.  Client Statement of Needs  Improve coping skills.  Symptoms  Autonomic hyperactivity (e.g., palpitations, shortness of breath, dry mouth, trouble swallowing, nausea, diarrhea). Excessive and/or unrealistic worry that is difficult to control occurring more days than not for at least 6 months about a number of events or activities. Hypervigilance (e.g., feeling constantly on edge, experiencing concentration difficulties, having trouble falling or staying asleep, exhibiting a general state of irritability). Motor tension (e.g., restlessness, tiredness, shakiness, muscle tension). Problems Addressed  Anxiety Goals 1. Enhance ability to effectively cope with the full variety of life's worries and anxieties. 2. Learn and implement coping skills that result in a reduction of anxiety and worry, and improved daily functioning. Objective Learn to accept limitations in life and commit to tolerating, rather than avoiding, unpleasant emotions while accomplishing meaningful goals. Target Date: 2024-07-09 Frequency:  Monthly Progress: 60 Modality: individual Related Interventions 1. Use techniques from Acceptance and Commitment Therapy to help client accept uncomfortable realities such as lack  of complete control, imperfections, and uncertainty and tolerate unpleasant emotions and thoughts in order to accomplish value-consistent goals. Objective Learn and implement  problem-solving strategies for realistically addressing worries. Target Date: 2024-07-09  Frequency:  Monthly Progress: 60 Modality: individual Related Interventions 1. Assign the client a homework exercise in which he/she problem-solves a current problem.  review, reinforce success, and provide corrective feedback toward improvement. 2. Teach the client problem-solving strategies involving specifically defining a problem, generating options for addressing it, evaluating the pros and cons of each option, selecting and implementing an optional action, and reevaluating and refining the action. Objective Learn and implement calming skills to reduce overall anxiety and manage anxiety symptoms. Target Date: 2024-07-09  Frequency: Monthly Progress: 60 Modality: individual Related Interventions 1. Assign the client to read about progressive muscle relaxation and other calming strategies in relevant books or treatment manuals (e.g., Progressive Relaxation Training by Thornell and Elmer; Mastery of Your Anxiety and Worry: Workbook by Richarda armin Given). 2. Assign the client homework each session in which he/she practices relaxation exercises daily, gradually applying them progressively from non-anxiety-provoking to anxiety-provoking situations; review and reinforce success while providing corrective feedback toward improvement. 3. Teach the client calming/relaxation skills (e.g., applied relaxation, progressive muscle relaxation, cue controlled relaxation; mindful breathing; biofeedback) and how to discriminate better between relaxation and tension; teach the client how to apply these skills to his/her daily life. 3. Reduce overall frequency, intensity, and duration of the anxiety so that daily functioning is not impaired. 4. Resolve the core conflict that is the source of anxiety. 5. Stabilize anxiety level while increasing ability to function on a daily basis. Diagnosis :   F43.22 Conditions For  Discharge Achievement of treatment goals and objectives.  Diontae Route, LCSW

## 2024-02-13 ENCOUNTER — Ambulatory Visit (INDEPENDENT_AMBULATORY_CARE_PROVIDER_SITE_OTHER): Admitting: Physician Assistant

## 2024-02-13 ENCOUNTER — Encounter: Payer: Self-pay | Admitting: Physician Assistant

## 2024-02-13 VITALS — BP 130/77 | HR 67 | Temp 97.5°F | Ht 63.5 in | Wt 132.0 lb

## 2024-02-13 DIAGNOSIS — G4762 Sleep related leg cramps: Secondary | ICD-10-CM | POA: Diagnosis not present

## 2024-02-13 DIAGNOSIS — E782 Mixed hyperlipidemia: Secondary | ICD-10-CM | POA: Diagnosis not present

## 2024-02-13 DIAGNOSIS — I10 Essential (primary) hypertension: Secondary | ICD-10-CM | POA: Diagnosis not present

## 2024-02-13 DIAGNOSIS — Z7689 Persons encountering health services in other specified circumstances: Secondary | ICD-10-CM

## 2024-02-13 NOTE — Assessment & Plan Note (Signed)
 130/77 Controlled. Continue current medications. No change in management. Discussed DASH diet and dietary sodium restrictions.  Continue dietary efforts and physical activity.

## 2024-02-13 NOTE — Patient Instructions (Signed)
 Magnesium  oxide or magnesium  glycinate

## 2024-02-13 NOTE — Assessment & Plan Note (Signed)
 Chronic leg cramps for 1.5 years, primarily in calves. She admits adequate hydration. No chest pain, shortness of breath, or leg swelling. Magnesium supplements recommended for cramps and sleep benefits. Advised against magnesium citrate due to gastrointestinal side effects. Considered gabapentin vs ropinirole however, patient states she does not want medication that could change her current sleep patterns. May need to consider discontinuing rosuvastatin  if leg cramps persist or become more bothersome.  - BMP and magnesium level today before starting magnesium supplementation to rule out electrolyte disturbances. - Provided information on magnesium. - Continue with vinegar if it is beneficial to symptoms.  - Follow up sooner if symptoms persist, worsen, or become more bothersome.

## 2024-02-13 NOTE — Progress Notes (Signed)
 New Patient Office Visit  Subjective    Patient ID: Virginia Brooks, female    DOB: [birth year]  Age: 73 y.o. MRN: [id number]  CC: No chief complaint on file.   HPI Virginia Brooks presents to establish care  Discussed the use of AI scribe software for clinical note transcription with the patient, who gave verbal consent to proceed.  History of Present Illness Virginia Brooks is a 73 year old female who presents with leg cramps.  She experiences leg cramps primarily in her calves and shins, occasionally affecting her feet, occurring one to three times per week. The cramps are bothersome and occur in both legs, though not always simultaneously. She manages them by taking a small amount of vinegar in water at night after her medications, which she believes helps reduce their severity.  Her current medications include rosuvastatin , amlodipine , and an 81 mg aspirin, all taken at night. She underwent cataract surgery in October and uses two eye drops daily. She gets up twice a night to use the bathroom but does not have sleep issues. She is up to date on preventative health and does not require refills today.    Outpatient Encounter Medications as of 02/13/2024  Medication Sig   amLODipine  (NORVASC ) 5 MG tablet Take 1 tablet (5 mg total) by mouth daily.   ASPIRIN LOW DOSE 81 MG EC tablet Take 81 mg by mouth daily.   cetirizine (ZYRTEC) 10 MG tablet Take 10 mg by mouth daily. Takes 1/2 tablet daily   cholecalciferol (VITAMIN D) 1000 UNITS tablet Take 1,000 Units by mouth daily.   Coenzyme Q10 (COQ10) 200 MG CAPS Take 1 tablet by mouth daily.   fish oil-omega-3 fatty acids 1000 MG capsule Take 2 g by mouth daily.   MULTIPLE VITAMINS PO Take 1 tablet by mouth daily.   rosuvastatin  (CRESTOR ) 5 MG tablet Take 1 tablet (5 mg total) by mouth daily.   [DISCONTINUED] ketotifen (ZADITOR) 0.025 % ophthalmic solution 1 drop 2 (two) times daily.   No facility-administered encounter medications on file as  of 02/13/2024.    Past Medical History:  Diagnosis Date   Endometrial hyperplasia without atypia, complex 04/24/2004   on hysteroscopic resection of polyps   History of COVID-19 09/27/2020   Hypercholesterolemia    Hypertension    Keratosis    Malignant melanoma of left forearm (HCC)    Shingles 11/2015   Spondylosis of cervical joint     Past Surgical History:  Procedure Laterality Date   BREAST SURGERY  03/1999   excision papilloma   CATARACT EXTRACTION BILATERAL W/ ANTERIOR VITRECTOMY Bilateral 01/10/2024   24   DILATION AND CURETTAGE OF UTERUS     HYSTEROSCOPY WITH RESECTOSCOPE  04/24/2004   MINOR BREAST BIOPSY Right 1994    Family History  Problem Relation Age of Onset   COPD Mother    Angina Mother    Stroke Father    Heart attack Father    Colon cancer Sister 55       colon ca   Cancer Sister    Cancer Brother        prostate/bladder/kidney ca   Colon cancer Other        3     Social History   Socioeconomic History   Marital status: Widowed    Spouse name: Not on file   Number of children: 2   Years of education: Not on file   Highest education level: Not on file  Occupational  History   Not on file  Tobacco Use   Smoking status: Never   Smokeless tobacco: Never  Vaping Use   Vaping status: Never Used  Substance and Sexual Activity   Alcohol use: No    Alcohol/week: 0.0 standard drinks of alcohol   Drug use: No   Sexual activity: Not Currently    Partners: Male    Birth control/protection: Post-menopausal    Comment: less than 5, after 73 y/o, no STD, no abnormal pap, no DES  Other Topics Concern   Not on file  Social History Narrative   Not on file   Social Drivers of Health   Financial Resource Strain: Not on file  Food Insecurity: Not on file  Transportation Needs: Not on file  Physical Activity: Not on file  Stress: Not on file  Social Connections: Not on file  Intimate Partner Violence: Not on file    Review of Systems   Constitutional:  Negative for chills, fever and malaise/fatigue.  Eyes:  Negative for blurred vision and double vision.  Respiratory:  Negative for cough and shortness of breath.   Cardiovascular:  Negative for chest pain, palpitations and leg swelling.  Musculoskeletal:        Leg cramps  Neurological:  Negative for dizziness and headaches.        Objective    BP 130/77   Pulse 67   Temp (!) 97.5 F (36.4 C)   Ht 5' 3.5 (1.613 m)   Wt 132 lb (59.9 kg)   LMP 04/02/2004   BMI 23.02 kg/m   Physical Exam Constitutional:      General: She is not in acute distress.    Appearance: Normal appearance. She is normal weight. She is not ill-appearing.  HENT:     Head: Normocephalic and atraumatic.     Mouth/Throat:     Mouth: Mucous membranes are moist.     Pharynx: Oropharynx is clear.  Eyes:     Extraocular Movements: Extraocular movements intact.     Conjunctiva/sclera: Conjunctivae normal.  Cardiovascular:     Rate and Rhythm: Normal rate and regular rhythm.     Heart sounds: Normal heart sounds. No murmur heard. Pulmonary:     Effort: Pulmonary effort is normal.     Breath sounds: Normal breath sounds.  Musculoskeletal:     Right lower leg: No edema.     Left lower leg: No edema.  Skin:    General: Skin is warm and dry.  Neurological:     General: No focal deficit present.     Mental Status: She is alert and oriented to person, place, and time.  Psychiatric:        Mood and Affect: Mood normal.        Behavior: Behavior normal.       Assessment & Plan:  Encounter to establish care  Nocturnal leg cramps Assessment & Plan: Chronic leg cramps for 1.5 years, primarily in calves. She admits adequate hydration. No chest pain, shortness of breath, or leg swelling. Magnesium supplements recommended for cramps and sleep benefits. Advised against magnesium citrate due to gastrointestinal side effects. Considered gabapentin vs ropinirole however, patient states she  does not want medication that could change her current sleep patterns. May need to consider discontinuing rosuvastatin  if leg cramps persist or become more bothersome.  - BMP and magnesium level today before starting magnesium supplementation to rule out electrolyte disturbances. - Provided information on magnesium. - Continue with vinegar if it is beneficial  to symptoms.  - Follow up sooner if symptoms persist, worsen, or become more bothersome.   Orders: -     Basic metabolic panel with GFR -     Magnesium  Primary hypertension Assessment & Plan: 130/77 Controlled. Continue current medications. No change in management. Discussed DASH diet and dietary sodium restrictions.  Continue dietary efforts and physical activity.    Mixed hyperlipidemia Assessment & Plan: Stable. Continue with current management without changes. Discussed healthy diet and lifestyle.     Return in about 8 months (around 10/12/2024) for annaul visit .   Charmaine Kourtlynn Trevor, PA-C

## 2024-02-13 NOTE — Assessment & Plan Note (Signed)
 Stable. Continue with current management without changes. Discussed healthy diet and lifestyle.

## 2024-02-14 ENCOUNTER — Ambulatory Visit: Payer: Self-pay | Admitting: Physician Assistant

## 2024-02-14 LAB — BASIC METABOLIC PANEL WITH GFR
BUN/Creatinine Ratio: 15 (ref 12–28)
BUN: 11 mg/dL (ref 8–27)
CO2: 23 mmol/L (ref 20–29)
Calcium: 9 mg/dL (ref 8.7–10.3)
Chloride: 104 mmol/L (ref 96–106)
Creatinine, Ser: 0.73 mg/dL (ref 0.57–1.00)
Glucose: 87 mg/dL (ref 70–99)
Potassium: 4.3 mmol/L (ref 3.5–5.2)
Sodium: 141 mmol/L (ref 134–144)
eGFR: 86 mL/min/1.73 (ref >59)

## 2024-02-14 LAB — MAGNESIUM: Magnesium: 2.4 mg/dL — ABNORMAL HIGH (ref 1.6–2.3)

## 2024-03-04 ENCOUNTER — Ambulatory Visit: Admitting: Psychology

## 2024-03-04 DIAGNOSIS — F4322 Adjustment disorder with anxiety: Secondary | ICD-10-CM

## 2024-03-04 NOTE — Progress Notes (Signed)
 [facility] Counselor/Therapist Progress Note  Patient ID: Virginia Brooks, MRN: [id number],    Date: 03/04/2024  Time Spent: 10:00am-10:55am   55 minutes   Treatment Type: Individual Therapy  Reported Symptoms: stress  Mental Status Exam: Appearance:  Casual     Behavior: Appropriate  Motor: Normal  Speech/Language:  Normal Rate  Affect: Appropriate  Mood: normal  Thought process: normal  Thought content:   WNL  Sensory/Perceptual disturbances:   WNL  Orientation: oriented to person, place, time/date, and situation  Attention: Good  Concentration: Good  Memory: WNL  Fund of knowledge:  Good  Insight:   Good  Judgment:  Good  Impulse Control: Good   Risk Assessment: Danger to Self:  No Self-injurious Behavior: No Danger to Others: No Duty to Warn:no Physical Aggression / Violence:No  Access to Firearms a concern: No  Gang Involvement:No   Subjective: Pt present for face-to-face individual therapy via video.  Pt consents to telehealth video session and is aware of limitations and benefits of virtual sessions. Location of pt: home Location of therapist: home office.   Pt talked about planning for Christmas.  It is stressful bc of all the decisions and work around gift giving.  Addressed all that pt has to do.   Worked on optician, dispensing.   Pt talked about care giving for her brother [name].   Pt is concerned about [name] health issues.   He has parkinsons.   Addressed the stress of care giving for [name].  Pt is trying to not have to go to [place] house every day to pace herself.  Worked on stress management and healthy boundary setting.  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 Worked on self care strategies. Provided supportive therapy.  Interventions: Cognitive Behavioral Therapy and Insight-Oriented  Diagnosis: F43.22  Plan of Care: Recommend ongoing therapy.   Pt participated in setting treatment goals.  Plan to continue to meet monthly.   Pt agrees with treatment plan.     Treatment Plan (Treatment Plan Target Date:  07/09/2024)  Client Abilities/Strengths  Pt is bright, engaging and motivated for therapy.  Client Treatment Preferences  Individual therapy.  Client Statement of Needs  Improve coping skills.  Symptoms  Autonomic hyperactivity (e.g., palpitations, shortness of breath, dry mouth, trouble swallowing, nausea, diarrhea). Excessive and/or unrealistic worry that is difficult to control occurring more days than not for at least 6 months about a number of events or activities. Hypervigilance (e.g., feeling constantly on edge, experiencing concentration difficulties, having trouble falling or staying asleep, exhibiting a general state of irritability). Motor tension (e.g., restlessness, tiredness, shakiness, muscle tension). Problems Addressed  Anxiety Goals 1. Enhance ability to effectively cope with the full variety of life's worries and anxieties. 2. Learn and implement coping skills that result in a reduction of anxiety and worry, and improved daily functioning. Objective Learn to accept limitations in life and commit to tolerating, rather than avoiding, unpleasant emotions while accomplishing meaningful goals. Target Date: 2024-07-09 Frequency:  Monthly Progress: 60 Modality: individual Related Interventions 1. Use techniques from Acceptance and Commitment Therapy to help client accept uncomfortable realities such as lack of complete control, imperfections, and uncertainty and tolerate unpleasant emotions and thoughts in order to accomplish value-consistent goals. Objective Learn and implement problem-solving strategies for realistically addressing worries. Target Date: 2024-07-09  Frequency:  Monthly Progress: 60 Modality: individual Related Interventions 1. Assign the client a homework exercise in which he/she problem-solves a current problem.  review, reinforce success, and  provide corrective feedback toward  improvement. 2. Teach the client problem-solving strategies involving specifically defining a problem, generating options for addressing it, evaluating the pros and cons of each option, selecting and implementing an optional action, and reevaluating and refining the action. Objective Learn and implement calming skills to reduce overall anxiety and manage anxiety symptoms. Target Date: 2024-07-09  Frequency: Monthly Progress: 60 Modality: individual Related Interventions 1. Assign the client to read about progressive muscle relaxation and other calming strategies in relevant books or treatment manuals (e.g., Progressive Relaxation Training by Thornell and Elmer; Mastery of Your Anxiety and Worry: Workbook by Richarda armin Given). 2. Assign the client homework each session in which he/she practices relaxation exercises daily, gradually applying them progressively from non-anxiety-provoking to anxiety-provoking situations; review and reinforce success while providing corrective feedback toward improvement. 3. Teach the client calming/relaxation skills (e.g., applied relaxation, progressive muscle relaxation, cue controlled relaxation; mindful breathing; biofeedback) and how to discriminate better between relaxation and tension; teach the client how to apply these skills to his/her daily life. 3. Reduce overall frequency, intensity, and duration of the anxiety so that daily functioning is not impaired. 4. Resolve the core conflict that is the source of anxiety. 5. Stabilize anxiety level while increasing ability to function on a daily basis. Diagnosis :   F43.22 Conditions For Discharge Achievement of treatment goals and objectives.  Jolleen Seman, LCSW

## 2024-04-08 ENCOUNTER — Ambulatory Visit: Admitting: Psychology

## 2024-04-08 DIAGNOSIS — F4322 Adjustment disorder with anxiety: Secondary | ICD-10-CM

## 2024-04-08 NOTE — Progress Notes (Signed)
 "  [facility] Counselor/Therapist Progress Note  Patient ID: DENICE CARDON, MRN: [id number],    Date: 04/08/2024  Time Spent: 10:00am-10:55am   55 minutes   Treatment Type: Individual Therapy  Reported Symptoms: stress, sadness  Mental Status Exam: Appearance:  Casual     Behavior: Appropriate  Motor: Normal  Speech/Language:  Normal Rate  Affect: Appropriate  Mood: normal  Thought process: normal  Thought content:   WNL  Sensory/Perceptual disturbances:   WNL  Orientation: oriented to person, place, time/date, and situation  Attention: Good  Concentration: Good  Memory: WNL  Fund of knowledge:  Good  Insight:   Good  Judgment:  Good  Impulse Control: Good   Risk Assessment: Danger to Self:  No Self-injurious Behavior: No Danger to Others: No Duty to Warn:no Physical Aggression / Violence:No  Access to Firearms a concern: No  Gang Involvement:No   Subjective: Pt present for face-to-face individual therapy via video.  Pt consents to telehealth video session and is aware of limitations and benefits of virtual sessions. Location of pt: home Location of therapist: home office.   Pt talked about some things happening in 2025/03/27.  Her neighbor passed away.  Pt checks in on that neighbor so it was upsetting to her.  Pt feels sad.  They have been neighbors for 50 years.  Pt has another friend who died later in March 27, 2025.  Pt also had two church friends pass away.  Pt attended four funerals.   Helped pt process her feelings and grief.   Pt talked about care giving for her brother [name].   Pt is concerned about [name] health issues.   He has parkinsons.   Addressed the stress of care giving for [facility].  Pt is trying to not have to go to [place] house every day to pace herself.  Worked on stress management and healthy boundary setting.  Worked on self care strategies. Provided supportive therapy.  Interventions: Cognitive Behavioral Therapy and  Insight-Oriented  Diagnosis: F43.22  Plan of Care: Recommend ongoing therapy.   Pt participated in setting treatment goals.  Plan to continue to meet monthly.  Pt agrees with treatment plan.     Treatment Plan (Treatment Plan Target Date:  07/09/2024)  Client Abilities/Strengths  Pt is bright, engaging and motivated for therapy.  Client Treatment Preferences  Individual therapy.  Client Statement of Needs  Improve coping skills.  Symptoms  Autonomic hyperactivity (e.g., palpitations, shortness of breath, dry mouth, trouble swallowing, nausea, diarrhea). Excessive and/or unrealistic worry that is difficult to control occurring more days than not for at least 6 months about a number of events or activities. Hypervigilance (e.g., feeling constantly on edge, experiencing concentration difficulties, having trouble falling or staying asleep, exhibiting a general state of irritability). Motor tension (e.g., restlessness, tiredness, shakiness, muscle tension). Problems Addressed  Anxiety Goals 1. Enhance ability to effectively cope with the full variety of life's worries and anxieties. 2. Learn and implement coping skills that result in a reduction of anxiety and worry, and improved daily functioning. Objective Learn to accept limitations in life and commit to tolerating, rather than avoiding, unpleasant emotions while accomplishing meaningful goals. Target Date: 2024-07-09 Frequency:  Monthly Progress: 60 Modality: individual Related Interventions 1. Use techniques from Acceptance and Commitment Therapy to help client accept uncomfortable realities such as lack of complete control, imperfections, and uncertainty and tolerate unpleasant emotions and thoughts in order to accomplish value-consistent goals. Objective Learn and implement problem-solving strategies for realistically addressing worries. Target  Date: 2024-07-09  Frequency:  Monthly Progress: 60 Modality: individual Related  Interventions 1. Assign the client a homework exercise in which he/she problem-solves a current problem.  review, reinforce success, and provide corrective feedback toward improvement. 2. Teach the client problem-solving strategies involving specifically defining a problem, generating options for addressing it, evaluating the pros and cons of each option, selecting and implementing an optional action, and reevaluating and refining the action. Objective Learn and implement calming skills to reduce overall anxiety and manage anxiety symptoms. Target Date: 2024-07-09  Frequency: Monthly Progress: 60 Modality: individual Related Interventions 1. Assign the client to read about progressive muscle relaxation and other calming strategies in relevant books or treatment manuals (e.g., Progressive Relaxation Training by Thornell and Elmer; Mastery of Your Anxiety and Worry: Workbook by Richarda armin Given). 2. Assign the client homework each session in which he/she practices relaxation exercises daily, gradually applying them progressively from non-anxiety-provoking to anxiety-provoking situations; review and reinforce success while providing corrective feedback toward improvement. 3. Teach the client calming/relaxation skills (e.g., applied relaxation, progressive muscle relaxation, cue controlled relaxation; mindful breathing; biofeedback) and how to discriminate better between relaxation and tension; teach the client how to apply these skills to his/her daily life. 3. Reduce overall frequency, intensity, and duration of the anxiety so that daily functioning is not impaired. 4. Resolve the core conflict that is the source of anxiety. 5. Stabilize anxiety level while increasing ability to function on a daily basis. Diagnosis :   F43.22 Conditions For Discharge Achievement of treatment goals and objectives.  Shylyn Younce, LCSW   "

## 2024-05-06 ENCOUNTER — Ambulatory Visit: Admitting: Psychology

## 2024-05-06 DIAGNOSIS — F4322 Adjustment disorder with anxiety: Secondary | ICD-10-CM
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
## 2024-06-03 ENCOUNTER — Ambulatory Visit: Admitting: Psychology

## 2024-07-01 ENCOUNTER — Ambulatory Visit: Admitting: Psychology

## 2024-10-13 ENCOUNTER — Ambulatory Visit: Admitting: Physician Assistant

## 2024-12-03 ENCOUNTER — Encounter: Admitting: Obstetrics and Gynecology
